# Patient Record
Sex: Female | Born: 1970 | Race: White | Hispanic: No | Marital: Married | Smoking: Former smoker
Health system: Southern US, Community
[De-identification: ages and names within clinical notes are randomized; demographics above are authoritative.]

## PROBLEM LIST (undated history)

## (undated) DIAGNOSIS — F419 Anxiety disorder, unspecified: Secondary | ICD-10-CM

## (undated) DIAGNOSIS — E785 Hyperlipidemia, unspecified: Secondary | ICD-10-CM

## (undated) HISTORY — DX: Anxiety disorder, unspecified: F41.9

## (undated) HISTORY — DX: Hyperlipidemia, unspecified: E78.5

---

## 2005-05-30 ENCOUNTER — Ambulatory Visit: Payer: Self-pay

## 2010-11-09 ENCOUNTER — Ambulatory Visit: Payer: Self-pay

## 2011-11-13 ENCOUNTER — Ambulatory Visit: Payer: Self-pay

## 2012-11-17 ENCOUNTER — Ambulatory Visit: Payer: Self-pay

## 2013-11-18 ENCOUNTER — Ambulatory Visit: Payer: Self-pay

## 2015-12-05 ENCOUNTER — Other Ambulatory Visit: Payer: Self-pay | Admitting: Obstetrics and Gynecology

## 2015-12-05 DIAGNOSIS — Z1231 Encounter for screening mammogram for malignant neoplasm of breast: Secondary | ICD-10-CM

## 2015-12-22 ENCOUNTER — Other Ambulatory Visit: Payer: Self-pay | Admitting: Obstetrics and Gynecology

## 2015-12-22 ENCOUNTER — Ambulatory Visit
Admission: RE | Admit: 2015-12-22 | Discharge: 2015-12-22 | Disposition: A | Discharge status: Home / Self Care | Payer: BC Managed Care – PPO | Source: Ambulatory Visit | Attending: Obstetrics and Gynecology | Admitting: Obstetrics and Gynecology

## 2015-12-22 DIAGNOSIS — Z1231 Encounter for screening mammogram for malignant neoplasm of breast: Secondary | ICD-10-CM

## 2016-11-15 ENCOUNTER — Other Ambulatory Visit: Payer: Self-pay | Admitting: Obstetrics and Gynecology

## 2016-11-15 DIAGNOSIS — Z1231 Encounter for screening mammogram for malignant neoplasm of breast: Secondary | ICD-10-CM

## 2016-12-27 ENCOUNTER — Ambulatory Visit
Admission: RE | Admit: 2016-12-27 | Discharge: 2016-12-27 | Disposition: A | Discharge status: Home / Self Care | Payer: BC Managed Care – PPO | Source: Ambulatory Visit | Attending: Obstetrics and Gynecology | Admitting: Obstetrics and Gynecology

## 2016-12-27 DIAGNOSIS — Z1231 Encounter for screening mammogram for malignant neoplasm of breast: Secondary | ICD-10-CM | POA: Diagnosis not present

## 2016-12-27 LAB — HM PAP SMEAR: HM Pap smear: NEGATIVE

## 2017-04-07 ENCOUNTER — Ambulatory Visit (INDEPENDENT_AMBULATORY_CARE_PROVIDER_SITE_OTHER): Payer: Self-pay | Admitting: Nurse Practitioner

## 2017-04-07 ENCOUNTER — Encounter: Payer: Self-pay | Admitting: Nurse Practitioner

## 2017-04-07 VITALS — BP 151/98 | HR 93 | Temp 98.2°F | Ht 65.0 in | Wt 220.0 lb

## 2017-04-07 DIAGNOSIS — Z7689 Persons encountering health services in other specified circumstances: Secondary | ICD-10-CM | POA: Diagnosis not present

## 2017-04-07 DIAGNOSIS — I10 Essential (primary) hypertension: Secondary | ICD-10-CM

## 2017-04-07 DIAGNOSIS — Z79899 Other long term (current) drug therapy: Secondary | ICD-10-CM | POA: Diagnosis not present

## 2017-04-07 DIAGNOSIS — E785 Hyperlipidemia, unspecified: Secondary | ICD-10-CM

## 2017-04-07 NOTE — Progress Notes (Signed)
Subjective:    Patient ID: Hannah Singleton, female    DOB: [birth year], 46 y.o.   MRN: [id number]  Hannah Singleton is a 46 y.o. female presenting on 04/07/2017 for Establish Care (elevated bp x 5 days. Heart palpations when she lays down at night x 5 days )   HPI Establish Care New Provider Pt last seen by GYN in February, [facility] in [place] 1 years ago.  Obtain records in CareEverywhere.    Hypertension Was initially told by her dentist Wednesday (5 days ago) that she had high blood pressure.  Also having heart palpitations each night x 5 days. Otherwise no symptoms.    Has been checking at home since Wednesday w/ home BP readings in 150s/ 95-100. HR 105-109  At prior medical visits, has had BP readings: 133/92 December 27, 2016 144/97 October 09, 2016 122/85 April 02, 2016 - 210 lbs  No prior EKG.  Pt denies headache, lightheadedness, dizziness, changes in vision, chest tightness/pressure, palpitations, leg swelling, sudden loss of speech or loss of consciousness.   Past Medical History:  Diagnosis Date  . Anxiety   . Hyperlipidemia    Past Surgical History:  Procedure Laterality Date  . CESAREAN SECTION     Social History   Social History  . Marital status: Married    Spouse name: N/A  . Number of children: N/A  . Years of education: N/A   Occupational History  . Not on file.   Social History Main Topics  . Smoking status: Former Smoker    Years: 3.00    Quit date: 04/08/2007  . Smokeless tobacco: Never Used     Comment: 2 cigarettes per day for 30 years  . Alcohol use 4.8 oz/week    8 Shots of liquor per week     Comment: no binge drinking  . Drug use: No  . Sexual activity: Yes    Birth control/ protection: Pill   Other Topics Concern  . Not on file   Social History Narrative  . No narrative on file   Family History  Problem Relation Age of Onset  . Diabetes Mother   . Depression Mother   . Colon cancer Father 66  . Breast cancer Neg Hx     No current outpatient prescriptions on file prior to visit.   No current facility-administered medications on file prior to visit.     Review of Systems  Constitutional: Negative.   HENT: Negative.   Eyes: Negative.   Respiratory: Negative.   Cardiovascular: Negative.   Gastrointestinal: Negative.   Endocrine: Negative.   Genitourinary: Negative.   Musculoskeletal: Negative.   Skin: Negative.   Allergic/Immunologic: Negative.   Neurological: Negative.   Hematological: Negative.   Psychiatric/Behavioral: Negative.    Per HPI unless specifically indicated above      Objective:    BP (!) 151/98 (BP Location: Right Arm, Patient Position: Sitting, Cuff Size: Large)   Pulse 93   Temp 98.2 F (36.8 C) (Oral)   Ht  (1.651 m)   Wt 220 lb (99.8 kg)   SpO2 100%   BMI 36.61 kg/m   Wt Readings from Last 3 Encounters:  04/07/17 220 lb (99.8 kg)    Physical Exam  General - obese, well-appearing, NAD HEENT - Normocephalic, atraumatic Neck - supple, non-tender, no LAD, no thyromegaly, no carotid bruit Heart - RRR, no murmurs heard Lungs - Clear throughout all lobes, no wheezing, crackles, or rhonchi. Normal work of breathing. Extremeties -  non-tender, no edema, cap refill < 2 seconds, peripheral pulses intact +2 bilaterally Skin - warm, dry Neuro - awake, alert, oriented x3, normal gait Psych - Normal mood and affect, normal behavior       Assessment & Plan:   Problem List Items Addressed This Visit      Cardiovascular and Mediastinum   Essential hypertension - Primary    New diagnosis hypertension.  Pt w/ elevation on multiple occasions at dentist, at home, and today in clinic.  BP goal < 130/80.  Plan: 1. Discussed hypertension and risks of CVA, hypertensive crisis, longterm kidney damage, eye damage, heart failure if untreated. 2. Start taking lisinopril.  Reviewed side effects of cough and angioedema.  If occur, please contact clinic. 3. Obtain labs CMP,  Lipid today  4. Encouraged heart healthy diet and increasing exercise to 30 minutes most days of the week. 5. Check BP 1-2 x per week at home, keep log, and bring to clinic at next appointment. 6. Follow up 1 month.        Relevant Orders   Comprehensive metabolic panel   BASIC METABOLIC PANEL WITH GFR   Lipid panel     Other   Hyperlipidemia    Pt w/ past diagnosis of hyperlipidemia not on medications.  Plan: 1. Check lipid and CMP.  2. Encouraged DASH diet. 3. Increase physical activity to 30 minutes most days of the week. 4. Follow up 3 months and after labs for medications if needed.      Relevant Orders   Comprehensive metabolic panel   Lipid panel    Other Visit Diagnoses    Encounter for long-term current use of high risk medication    Recheck BMP after starting lisinopril.  Discussed need for labs w/ patient.    Relevant Orders   BASIC METABOLIC PANEL WITH GFR   Encounter to establish care     Previous PCP was at  [facility] in [place].  Records have been reviewed in CareEverywhere.  Past medical, family, and surgical history reviewed w/ pt.       Meds ordered this encounter  Medications  . escitalopram (LEXAPRO) 5 MG tablet    Refill:  3  . norethindrone-ethinyl estradiol (MICROGESTIN,JUNEL,LOESTRIN) 1-20 MG-MCG tablet    Refill:  3    Follow up plan: Return in about 4 weeks (around 05/05/2017).  [name], DNP, AGPCNP-BC Adult Gerontology Primary Care Nurse Practitioner [facility] 04/10/2017, 3:43 PM

## 2017-04-07 NOTE — Patient Instructions (Addendum)
Hannah Singleton, Thank you for coming in to clinic today.  1. For your hypertension: - START with DASH eating plan.  Choose 1-2 things from this eating plan each week to start doing until you are eating mostly like this eating plan.  - Increase your physical activity until you are increasing your heart rate for 30 minutes on most days of the week.  - START lisinopril 10 mg once daily.  - 130/80 or less BUT higher than 100/70.    Signs of stroke? Act FAST! Face weakness or drooping Arm weakness (or leg weakness) Speech changes (loss of speech, slurred, or sounds that are not words) Time: act quickly, Call 9-1-1.  Preserve brain tissue!   Please schedule a follow-up appointment with Hannah Singleton, AGNP. Return in about 4 weeks (around 05/05/2017).  If you have any other questions or concerns, please feel free to call the clinic or send a message through MyChart. You may also schedule an earlier appointment if necessary.  You will receive a survey after today's visit either digitally by e-mail or paper by [employer]. Your experiences and feedback matter to [place].  Please respond so we know how we are doing as we provide care for you.   Hannah Mcardle, DNP, AGNP-BC Adult Gerontology Nurse Practitioner [facility], [facility]   DASH Eating Plan DASH stands for "Dietary Approaches to Stop Hypertension." The DASH eating plan is a healthy eating plan that has been shown to reduce high blood pressure (hypertension). It may also reduce your risk for type 2 diabetes, heart disease, and stroke. The DASH eating plan may also help with weight loss. What are tips for following this plan? General guidelines  Avoid eating more than 2,300 mg (milligrams) of salt (sodium) a day. If you have hypertension, you may need to reduce your sodium intake to 1,500 mg a day.  Limit alcohol intake to no more than 1 drink a day for nonpregnant women and 2 drinks a day for men. One drink equals 12 oz of beer, 5  oz of wine, or 1 oz of hard liquor.  Work with your health care provider to maintain a healthy body weight or to lose weight. Ask what an ideal weight is for you.  Get at least 30 minutes of exercise that causes your heart to beat faster (aerobic exercise) most days of the week. Activities may include walking, swimming, or biking.  Work with your health care provider or diet and nutrition specialist (dietitian) to adjust your eating plan to your individual calorie needs. Reading food labels  Check food labels for the amount of sodium per serving. Choose foods with less than 5 percent of the Daily Value of sodium. Generally, foods with less than 300 mg of sodium per serving fit into this eating plan.  To find whole grains, look for the word "whole" as the first word in the ingredient list. Shopping  Buy products labeled as "low-sodium" or "no salt added."  Buy fresh foods. Avoid canned foods and premade or frozen meals. Cooking  Avoid adding salt when cooking. Use salt-free seasonings or herbs instead of table salt or sea salt. Check with your health care provider or pharmacist before using salt substitutes.  Do not fry foods. Cook foods using healthy methods such as baking, boiling, grilling, and broiling instead.  Cook with heart-healthy oils, such as olive, canola, soybean, or sunflower oil. Meal planning   Eat a balanced diet that includes: ? 5 or more servings of fruits and  vegetables each day. At each meal, try to fill half of your plate with fruits and vegetables. ? Up to 6-8 servings of whole grains each day. ? Less than 6 oz of lean meat, poultry, or fish each day. A 3-oz serving of meat is about the same size as a deck of cards. One egg equals 1 oz. ? 2 servings of low-fat dairy each day. ? A serving of nuts, seeds, or beans 5 times each week. ? Heart-healthy fats. Healthy fats called Omega-3 fatty acids are found in foods such as flaxseeds and coldwater fish, like  sardines, salmon, and mackerel.  Limit how much you eat of the following: ? Canned or prepackaged foods. ? Food that is high in trans fat, such as fried foods. ? Food that is high in saturated fat, such as fatty meat. ? Sweets, desserts, sugary drinks, and other foods with added sugar. ? Full-fat dairy products.  Do not salt foods before eating.  Try to eat at least 2 vegetarian meals each week.  Eat more home-cooked food and less restaurant, buffet, and fast food.  When eating at a restaurant, ask that your food be prepared with less salt or no salt, if possible. What foods are recommended? The items listed may not be a complete list. Talk with your dietitian about what dietary choices are best for you. Grains Whole-grain or whole-wheat bread. Whole-grain or whole-wheat pasta. Brown rice. Orpah Cobb. Bulgur. Whole-grain and low-sodium cereals. Pita bread. Low-fat, low-sodium crackers. Whole-wheat flour tortillas. Vegetables Fresh or frozen vegetables (raw, steamed, roasted, or grilled). Low-sodium or reduced-sodium tomato and vegetable juice. Low-sodium or reduced-sodium tomato sauce and tomato paste. Low-sodium or reduced-sodium canned vegetables. Fruits All fresh, dried, or frozen fruit. Canned fruit in natural juice (without added sugar). Meat and other protein foods Skinless chicken or [place]. Ground chicken or [place]. Pork with fat trimmed off. Fish and seafood. Egg whites. Dried beans, peas, or lentils. Unsalted nuts, nut butters, and seeds. Unsalted canned beans. Lean cuts of beef with fat trimmed off. Low-sodium, lean deli meat. Dairy Low-fat (1%) or fat-free (skim) milk. Fat-free, low-fat, or reduced-fat cheeses. Nonfat, low-sodium ricotta or cottage cheese. Low-fat or nonfat yogurt. Low-fat, low-sodium cheese. Fats and oils Soft margarine without trans fats. Vegetable oil. Low-fat, reduced-fat, or light mayonnaise and salad dressings (reduced-sodium). Canola, safflower,  olive, soybean, and sunflower oils. Avocado. Seasoning and other foods Herbs. Spices. Seasoning mixes without salt. Unsalted popcorn and pretzels. Fat-free sweets. What foods are not recommended? The items listed may not be a complete list. Talk with your dietitian about what dietary choices are best for you. Grains Baked goods made with fat, such as croissants, muffins, or some breads. Dry pasta or rice meal packs. Vegetables Creamed or fried vegetables. Vegetables in a cheese sauce. Regular canned vegetables (not low-sodium or reduced-sodium). Regular canned tomato sauce and paste (not low-sodium or reduced-sodium). Regular tomato and vegetable juice (not low-sodium or reduced-sodium). Rosita Fire. Olives. Fruits Canned fruit in a light or heavy syrup. Fried fruit. Fruit in cream or butter sauce. Meat and other protein foods Fatty cuts of meat. Ribs. Fried meat. Tomasa Blase. Sausage. Bologna and other processed lunch meats. Salami. Fatback. Hotdogs. Bratwurst. Salted nuts and seeds. Canned beans with added salt. Canned or smoked fish. Whole eggs or egg yolks. Chicken or [place] with skin. Dairy Whole or 2% milk, cream, and half-and-half. Whole or full-fat cream cheese. Whole-fat or sweetened yogurt. Full-fat cheese. Nondairy creamers. Whipped toppings. Processed cheese and cheese spreads. Fats and oils  Butter. Stick margarine. Lard. Shortening. Ghee. Bacon fat. Tropical oils, such as coconut, palm kernel, or palm oil. Seasoning and other foods Salted popcorn and pretzels. Onion salt, garlic salt, seasoned salt, table salt, and sea salt. Worcestershire sauce. Tartar sauce. Barbecue sauce. Teriyaki sauce. Soy sauce, including reduced-sodium. Steak sauce. Canned and packaged gravies. Fish sauce. Oyster sauce. Cocktail sauce. Horseradish that you find on the shelf. Ketchup. Mustard. Meat flavorings and tenderizers. Bouillon cubes. Hot sauce and Tabasco sauce. Premade or packaged marinades. Premade or packaged  taco seasonings. Relishes. Regular salad dressings. Where to find more information:  National Heart, Lung, and Blood Institute: PopSteam.is  American Heart Association: www.heart.org Summary  The DASH eating plan is a healthy eating plan that has been shown to reduce high blood pressure (hypertension). It may also reduce your risk for type 2 diabetes, heart disease, and stroke.  With the DASH eating plan, you should limit salt (sodium) intake to 2,300 mg a day. If you have hypertension, you may need to reduce your sodium intake to 1,500 mg a day.  When on the DASH eating plan, aim to eat more fresh fruits and vegetables, whole grains, lean proteins, low-fat dairy, and heart-healthy fats.  Work with your health care provider or diet and nutrition specialist (dietitian) to adjust your eating plan to your individual calorie needs. This information is not intended to replace advice given to you by your health care provider. Make sure you discuss any questions you have with your health care provider. Document Released: 06/27/2011 Document Revised: 07/01/2016 Document Reviewed: 07/01/2016 Elsevier Interactive Patient Education  2017 [employer].

## 2017-04-08 ENCOUNTER — Other Ambulatory Visit: Payer: Self-pay

## 2017-04-08 MED ORDER — LISINOPRIL 10 MG PO TABS: 10.0000 mg | ORAL_TABLET | Freq: Every day | ORAL | 6 refills | Status: DC

## 2017-04-10 DIAGNOSIS — E785 Hyperlipidemia, unspecified: Secondary | ICD-10-CM | POA: Insufficient documentation

## 2017-04-10 DIAGNOSIS — I10 Essential (primary) hypertension: Secondary | ICD-10-CM | POA: Insufficient documentation

## 2017-04-10 NOTE — Assessment & Plan Note (Signed)
New diagnosis hypertension.  Pt w/ elevation on multiple occasions at dentist, at home, and today in clinic.  BP goal < 130/80.  Plan: 1. Discussed hypertension and risks of CVA, hypertensive crisis, longterm kidney damage, eye damage, heart failure if untreated. 2. Start taking lisinopril.  Reviewed side effects of cough and angioedema.  If occur, please contact clinic. 3. Obtain labs CMP, Lipid today  4. Encouraged heart healthy diet and increasing exercise to 30 minutes most days of the week. 5. Check BP 1-2 x per week at home, keep log, and bring to clinic at next appointment. 6. Follow up 1 month.

## 2017-04-10 NOTE — Assessment & Plan Note (Signed)
Pt w/ past diagnosis of hyperlipidemia not on medications.  Plan: 1. Check lipid and CMP.  2. Encouraged DASH diet. 3. Increase physical activity to 30 minutes most days of the week. 4. Follow up 3 months and after labs for medications if needed.

## 2017-04-11 ENCOUNTER — Other Ambulatory Visit: Payer: BC Managed Care – PPO

## 2017-04-11 DIAGNOSIS — Z79899 Other long term (current) drug therapy: Secondary | ICD-10-CM

## 2017-04-11 DIAGNOSIS — I10 Essential (primary) hypertension: Secondary | ICD-10-CM

## 2017-04-11 LAB — BASIC METABOLIC PANEL WITH GFR
BUN: 14 mg/dL (ref 7–25)
CO2: 22 mmol/L (ref 20–32)
Calcium: 8.7 mg/dL (ref 8.6–10.2)
Chloride: 101 mmol/L (ref 98–110)
Creat: 0.95 mg/dL (ref 0.50–1.10)
GFR, Est African American: 83 mL/min/{1.73_m2} (ref >=60)
GFR, Est Non African American: 71 mL/min/{1.73_m2} (ref >=60)
Glucose, Bld: 115 mg/dL — ABNORMAL HIGH (ref 65–99)
Potassium: 4.1 mmol/L (ref 3.5–5.3)
Sodium: 133 mmol/L — ABNORMAL LOW (ref 135–146)

## 2017-04-11 LAB — LIPID PANEL
Cholesterol: 250 mg/dL — ABNORMAL HIGH (ref <200)
HDL: 59 mg/dL (ref >50)
LDL Cholesterol (Calc): 150 mg/dL (calc) — ABNORMAL HIGH
Non-HDL Cholesterol (Calc): 191 mg/dL (calc) — ABNORMAL HIGH (ref <130)
Total CHOL/HDL Ratio: 4.2 (calc) (ref <5.0)
Triglycerides: 249 mg/dL — ABNORMAL HIGH (ref <150)

## 2017-05-08 ENCOUNTER — Ambulatory Visit (INDEPENDENT_AMBULATORY_CARE_PROVIDER_SITE_OTHER): Payer: BC Managed Care – PPO | Admitting: Nurse Practitioner

## 2017-05-08 ENCOUNTER — Encounter: Payer: Self-pay | Admitting: Nurse Practitioner

## 2017-05-08 VITALS — BP 122/67 | HR 88 | Temp 98.6°F | Ht 65.0 in | Wt 218.0 lb

## 2017-05-08 DIAGNOSIS — R7309 Other abnormal glucose: Secondary | ICD-10-CM

## 2017-05-08 DIAGNOSIS — I10 Essential (primary) hypertension: Secondary | ICD-10-CM

## 2017-05-08 NOTE — Patient Instructions (Addendum)
Hannah Singleton, Thank you for coming in to clinic today.  1. Your blood pressure looks great in clinic today! - Bring your machine to the clinic and we can see if it is accurate compared to a manual cuff.  [facility] can do this any day of the week at any time.  Please make an appointment with them.  - Continue lisinopril 10 mg once daily.   Please schedule a follow-up appointment with Hannah Singleton, Hannah Singleton. Return in about 3 months (around 08/08/2017) for hypertension.   If you have any other questions or concerns, please feel free to call the clinic or send a message through MyChart. You may also schedule an earlier appointment if necessary.  You will receive a survey after today's visit either digitally by e-mail or paper by [employer] mail. Your experiences and feedback matter to us.  Please respond so we know how we are doing as we provide care for you.   Hannah McardleLauren Anvith Mauriello, DNP, Hannah Singleton-BC Adult Gerontology Nurse Practitioner [facility], [facility]

## 2017-05-08 NOTE — Progress Notes (Signed)
Subjective:    Patient ID: Hannah Singleton, female    DOB: September 03, 1970, 46 y.o.   MRN: [id number]  Peony Barner is a 46 y.o. female presenting on 05/08/2017 for Hypertension   HPI Hypertension Felt "terrible" the first few days on medication, but improved w/ daily med dosing.   - She is checking BP at home or outside of clinic.  Readings 140-160/80-90  One reading 128/84.  She has a new BP cuff and is confused by her reading in clinic today after her home readings. - Current medications: lisinopril, tolerating well without side effects - She is not currently symptomatic. - Pt denies headache, lightheadedness, dizziness, changes in vision, chest tightness/pressure, palpitations, leg swelling, sudden loss of speech or loss of consciousness. - She  reports an exercise routine that includes walking, 4  days per week. - Her diet is low in salt, moderate in fat, and moderate in carbohydrates.   Social History  Substance Use Topics  . Smoking status: Former Smoker    Years: 3.00    Quit date: 04/08/2007  . Smokeless tobacco: Never Used     Comment: 2 cigarettes per day for 30 years  . Alcohol use 4.8 oz/week    8 Shots of liquor per week     Comment: no binge drinking    Review of Systems Per HPI unless specifically indicated above     Objective:    BP 122/67 (BP Location: Left Arm, Patient Position: Sitting, Cuff Size: Large)   Pulse 88   Temp 98.6 F (37 C) (Oral)   Ht 5\' 5"  (1.651 m)   Wt 218 lb (98.9 kg)   BMI 36.28 kg/m   Wt Readings from Last 3 Encounters:  05/08/17 218 lb (98.9 kg)  04/07/17 220 lb (99.8 kg)    Physical Exam  General - healthy, well-appearing, NAD HEENT - Normocephalic, atraumatic Neck - supple, non-tender, no LAD, no carotid bruit Heart - RRR, no murmurs heard Lungs - Clear throughout all lobes, no wheezing, crackles, or rhonchi. Normal work of breathing. Extremeties - non-tender, no edema, cap refill < 2 seconds, peripheral pulses intact +2  bilaterally Skin - warm, dry Neuro - awake, alert, oriented x3, normal gait Psych - Normal mood and affect, normal behavior    Results for orders placed or performed in visit on 04/11/17  BASIC METABOLIC PANEL WITH GFR  Result Value Ref Range   Glucose, Bld 115 (H) 65 - 99 mg/dL   BUN 14 7 - 25 mg/dL   Creat 4.09 8.11 - 9.14 mg/dL   GFR, Est Non African American 71 > OR = 60 mL/min/1.2m2   GFR, Est African American 83 > OR = 60 mL/min/1.65m2   BUN/Creatinine Ratio NOT APPLICABLE 6 - 22 (calc)   Sodium 133 (L) 135 - 146 mmol/L   Potassium 4.1 3.5 - 5.3 mmol/L   Chloride 101 98 - 110 mmol/L   CO2 22 20 - 32 mmol/L   Calcium 8.7 8.6 - 10.2 mg/dL  Lipid panel  Result Value Ref Range   Cholesterol 250 (H) <200 mg/dL   HDL 59 >78 mg/dL   Triglycerides 295 (H) <150 mg/dL   LDL Cholesterol (Calc) 150 (H) mg/dL (calc)   Total CHOL/HDL Ratio 4.2 <5.0 (calc)   Non-HDL Cholesterol (Calc) 191 (H) <130 mg/dL (calc)      Assessment & Plan:   Problem List Items Addressed This Visit      Cardiovascular and Mediastinum   Essential hypertension - Primary  Improved and at goal in clinic today.  BP goal < 130/80. Home readings consistently remain elevated, but have come down since starting medications.  Likely, home BP cuff needs calibration.  Plan: 1. Continue home BP checks.  Reinforced BP goal.  Can bring BP machine to clinic for reading w/ manual cuff at same time as machine for calibration.  Bring manual for machine. 2. Continue lisinopril 10 mg once daily since no side effects experienced. 3. Obtain labs BMP today assess kidney function after starting lisinopril. 4. Encouraged heart healthy diet and increasing exercise to 30 minutes most days of the week. 5. Follow up 3 months.        Relevant Orders   BASIC METABOLIC PANEL WITH GFR     Other   Abnormal glucose    Fasting glucose = 115 at last labs.  Family history of diabetes.  Plan: 1. Check Hgb A1c w/ labs today. 2.  Followup at least 1x per year if A1c normal.      Relevant Orders   Hemoglobin A1c       Follow up plan: Return in about 3 months (around 08/08/2017) for hypertension.  Wilhelmina McardleLauren Kenzli Barritt, DNP, AGPCNP-BC Adult Gerontology Primary Care Nurse Practitioner [facility] 05/08/2017, 10:05 AM

## 2017-05-08 NOTE — Assessment & Plan Note (Signed)
Fasting glucose = 115 at last labs.  Family history of diabetes.  Plan: 1. Check Hgb A1c w/ labs today. 2. Followup at least 1x per year if A1c normal.

## 2017-05-08 NOTE — Assessment & Plan Note (Signed)
Improved and at goal in clinic today.  BP goal < 130/80. Home readings consistently remain elevated, but have come down since starting medications.  Likely, home BP cuff needs calibration.  Plan: 1. Continue home BP checks.  Reinforced BP goal.  Can bring BP machine to clinic for reading w/ manual cuff at same time as machine for calibration.  Bring manual for machine. 2. Continue lisinopril 10 mg once daily since no side effects experienced. 3. Obtain labs BMP today assess kidney function after starting lisinopril. 4. Encouraged heart healthy diet and increasing exercise to 30 minutes most days of the week. 5. Follow up 3 months.

## 2017-05-09 LAB — BASIC METABOLIC PANEL WITH GFR
BUN: 12 mg/dL (ref 7–25)
CO2: 26 mmol/L (ref 20–32)
Calcium: 8.9 mg/dL (ref 8.6–10.2)
Chloride: 101 mmol/L (ref 98–110)
Creat: 0.85 mg/dL (ref 0.50–1.10)
GFR, Est African American: 95 mL/min/{1.73_m2} (ref >=60)
GFR, Est Non African American: 82 mL/min/{1.73_m2} (ref >=60)
Glucose, Bld: 110 mg/dL — ABNORMAL HIGH (ref 65–99)
Potassium: 4.2 mmol/L (ref 3.5–5.3)
Sodium: 136 mmol/L (ref 135–146)

## 2017-05-09 LAB — HEMOGLOBIN A1C
Hgb A1c MFr Bld: 5.5 % of total Hgb (ref <5.7)
Mean Plasma Glucose: 111 (calc)
eAG (mmol/L): 6.2 (calc)

## 2017-05-27 ENCOUNTER — Ambulatory Visit: Payer: BC Managed Care – PPO

## 2017-05-27 VITALS — BP 118/72 | HR 90

## 2017-05-27 DIAGNOSIS — I10 Essential (primary) hypertension: Secondary | ICD-10-CM

## 2017-06-10 ENCOUNTER — Telehealth: Payer: BC Managed Care – PPO | Admitting: Nurse Practitioner

## 2017-06-10 DIAGNOSIS — N3000 Acute cystitis without hematuria: Secondary | ICD-10-CM

## 2017-06-10 MED ORDER — CEPHALEXIN 500 MG PO CAPS: 500.0000 mg | ORAL_CAPSULE | Freq: Two times a day (BID) | ORAL | 0 refills | Status: DC

## 2017-06-10 NOTE — Progress Notes (Signed)

## 2017-08-08 ENCOUNTER — Ambulatory Visit: Payer: BC Managed Care – PPO | Admitting: Nurse Practitioner

## 2017-08-08 ENCOUNTER — Other Ambulatory Visit: Payer: Self-pay

## 2017-08-08 VITALS — BP 122/81 | HR 93 | Temp 98.1°F | Ht 65.0 in | Wt 220.4 lb

## 2017-08-08 DIAGNOSIS — I10 Essential (primary) hypertension: Secondary | ICD-10-CM

## 2017-08-08 DIAGNOSIS — F419 Anxiety disorder, unspecified: Secondary | ICD-10-CM | POA: Insufficient documentation

## 2017-08-08 MED ORDER — ESCITALOPRAM OXALATE 10 MG PO TABS: 10.0000 mg | ORAL_TABLET | Freq: Every day | ORAL | 5 refills | Status: DC

## 2017-08-08 MED ORDER — LISINOPRIL 10 MG PO TABS: 10.0000 mg | ORAL_TABLET | Freq: Every day | ORAL | 6 refills | Status: DC

## 2017-08-08 NOTE — Patient Instructions (Addendum)
Hannah Singleton, Thank you for coming in to clinic today.  1. Increase your escitalopram to 10 mg once daily.  - Continue to work on stress management strategies.  - Increase your physical activity until you are increasing your heart rate for 30 minutes on most days of the week.  Sleep Hygiene Tips  Take medicines only as directed by your health care provider.  Keep regular sleeping and waking hours. Avoid naps.  Keep a sleep diary to help you and your health care provider figure out what could be causing your insomnia. Include:  When you sleep.  When you wake up during the night.  How well you sleep.  How rested you feel the next day.  Any side effects of medicines you are taking.  What you eat and drink.  Make your bedroom a comfortable place where it is easy to fall asleep:  Put up shades or special blackout curtains to block light from outside.  Use a white noise machine to block noise.  Keep the temperature cool.  Exercise regularly as directed by your health care provider. Avoid exercising right before bedtime.  Use relaxation techniques to manage stress. Ask your health care provider to suggest some techniques that may work well for you. These may include:  Breathing exercises.  Routines to release muscle tension.  Visualizing peaceful scenes.  Cut back on alcohol, caffeinated beverages, and cigarettes, especially close to bedtime. These can disrupt your sleep.  Do not overeat or eat spicy foods right before bedtime. This can lead to digestive discomfort that can make it hard for you to sleep.  Limit screen use before bedtime. This includes:  Watching TV.  Using your smartphone, tablet, and computer.  Stick to a routine. This can help you fall asleep faster. Try to do a quiet activity, brush your teeth, and go to bed at the same time each night.  Get out of bed if you are still awake after 15 minutes of trying to sleep. Keep the lights down, but try reading or  doing a quiet activity. When you feel sleepy, go back to bed.  Make sure that you drive carefully. Avoid driving if you feel very sleepy.  Keep all follow-up appointments as directed by your health care provider. This is important.   2. For blood pressure: - Continue lisinopril 10 mg once daily.   Please schedule a follow-up appointment with Wilhelmina McardleLauren Mariangela Heldt, AGNP. Return in about 6 months (around 02/05/2018) for blood pressure and anxiety.  If you have any other questions or concerns, please feel free to call the clinic or send a message through MyChart. You may also schedule an earlier appointment if necessary.  You will receive a survey after today's visit either digitally by e-mail or paper by [employer] mail. Your experiences and feedback matter to us.  Please respond so we know how we are doing as we provide care for you.   Wilhelmina McardleLauren Alyiah Ulloa, DNP, AGNP-BC Adult Gerontology Nurse Practitioner [facility], [facility]

## 2017-08-08 NOTE — Progress Notes (Signed)
Subjective:    Patient ID: Hannah BaxterCynthia Singleton, female    DOB: 1971-07-01, 47 y.o.   MRN: [id number]  Hannah Singleton is a 47 y.o. female presenting on 08/08/2017 for Hypertension   HPI Hypertension - She is not checking BP at home or outside of clinic.    - Current medications: lisinopril 10 mg once daily, tolerating well without side effects - She is not currently symptomatic. - Pt denies headache, lightheadedness, dizziness, changes in vision, chest tightness/pressure, palpitations, leg swelling, sudden loss of speech or loss of consciousness. - She  reports no regular exercise routine, but has had some increase in activity without routine now that it is cold and has been so rainy. - Her diet is moderate in salt, high in fat, and moderate in carbohydrates.  Anxiety Pt has been taking escitalopram 5 mg once daily.  Has more irritability about things that should not irritate her and does regularly worry about things that don't need to be worried about.  She also reports difficulty with sleep.  She is able to fall asleep, but sometimes has trouble getting her thoughts to start racing.  She also awakens in the middle of the night and is awake for 2 hours before she can fall back to sleep.  She states racing thoughts is the reason to stay awake.  Depression screen [facility] 2/9 08/08/2017 08/08/2017 04/07/2017  Decreased Interest 0 0 0  Down, Depressed, Hopeless 0 0 0  PHQ - 2 Score 0 0 0  Altered sleeping 1 1 0  Tired, decreased energy 0 0 1  Change in appetite 1 1 0  Feeling bad or failure about yourself  0 0 0  Trouble concentrating 0 0 0  Moving slowly or fidgety/restless 0 0 0  Suicidal thoughts 0 0 0  PHQ-9 Score 2 2 1   Difficult doing work/chores Not difficult at all Not difficult at all Not difficult at all   GAD 7 : Generalized Anxiety Score 08/08/2017 04/07/2017  Nervous, Anxious, on Edge 1 0  Control/stop worrying 0 0  Worry too much - different things 1 1  Trouble relaxing 0 0  Restless 0  0  Easily annoyed or irritable 1 0  Afraid - awful might happen 0 0  Total GAD 7 Score 3 1  Anxiety Difficulty Not difficult at all Not difficult at all    Social History   Tobacco Use  . Smoking status: Former Smoker    Years: 3.00    Last attempt to quit: 04/08/2007    Years since quitting: 10.4  . Smokeless tobacco: Never Used  . Tobacco comment: 2 cigarettes per day for 30 years  Substance Use Topics  . Alcohol use: Yes    Alcohol/week: 4.8 oz    Types: 8 Shots of liquor per week    Comment: no binge drinking  . Drug use: No    Review of Systems Per HPI unless specifically indicated above     Objective:    BP 122/81 (BP Location: Right Arm, Patient Position: Sitting, Cuff Size: Large)   Pulse 93   Temp 98.1 F (36.7 C) (Oral)   Ht 5\' 5"  (1.651 m)   Wt 220 lb 6.4 oz (100 kg)   BMI 36.68 kg/m   Wt Readings from Last 3 Encounters:  08/08/17 220 lb 6.4 oz (100 kg)  05/08/17 218 lb (98.9 kg)  04/07/17 220 lb (99.8 kg)    Physical Exam  General - overweight, well-appearing, NAD HEENT - Normocephalic,  atraumatic Neck - supple, non-tender, no LAD, no thyromegaly, no carotid bruit Heart - RRR, no murmurs heard Lungs - Clear throughout all lobes, no wheezing, crackles, or rhonchi. Normal work of breathing. Extremeties - non-tender, no edema, cap refill < 2 seconds, peripheral pulses intact +2 bilaterally Skin - warm, dry Neuro - awake, alert, oriented x3, normal gait Psych - Normal mood and affect, normal behavior   Results for orders placed or performed in visit on 05/08/17  BASIC METABOLIC PANEL WITH GFR  Result Value Ref Range   Glucose, Bld 110 (H) 65 - 99 mg/dL   BUN 12 7 - 25 mg/dL   Creat 1.61 0.96 - 0.45 mg/dL   GFR, Est Non African American 82 > OR = 60 mL/min/1.103m2   GFR, Est African American 95 > OR = 60 mL/min/1.56m2   BUN/Creatinine Ratio NOT APPLICABLE 6 - 22 (calc)   Sodium 136 135 - 146 mmol/L   Potassium 4.2 3.5 - 5.3 mmol/L   Chloride 101  98 - 110 mmol/L   CO2 26 20 - 32 mmol/L   Calcium 8.9 8.6 - 10.2 mg/dL  Hemoglobin W0J  Result Value Ref Range   Hgb A1c MFr Bld 5.5 <5.7 % of total Hgb   Mean Plasma Glucose 111 (calc)   eAG (mmol/L) 6.2 (calc)      Assessment & Plan:   Problem List Items Addressed This Visit      Cardiovascular and Mediastinum   Essential hypertension - Primary    Improved and at goal in clinic today.  BP goal < 130/80. Home readings consistently remain elevated and is found to be inaccurate.  Plan: 1. Continue home BP checks with alternative machine.  Reinforced BP goal.  Can bring BP machine to clinic for reading w/ manual cuff at same time as machine for calibration.  Bring manual for machine. 2. Continue lisinopril 10 mg once daily since no side effects experienced. 3. Last labs normal in last 3 months. 4. Encouraged heart healthy diet and increasing exercise to 30 minutes most days of the week. 5. Follow up 6 months.        Relevant Medications   lisinopril (ZESTRIL) 10 MG tablet     Other   Anxiety    Pt currently stable on escitalopram 10 mg once daily.  Tolerates well and is currently managing stress well.   Plan: 1. Encouraged continued physical activity/healthy lifestyle. 2. Continue escitalpram without changes. 3. Followup 6 months.      Relevant Medications   escitalopram (LEXAPRO) 10 MG tablet   lisinopril (ZESTRIL) 10 MG tablet      Meds ordered this encounter  Medications  . escitalopram (LEXAPRO) 10 MG tablet    Sig: Take 1 tablet (10 mg total) by mouth daily.    Dispense:  30 tablet    Refill:  5    Order Specific Question:   Supervising Provider    Answer:   Smitty Cords [[id number]]  . lisinopril (ZESTRIL) 10 MG tablet    Sig: Take 1 tablet (10 mg total) by mouth daily.    Dispense:  30 tablet    Refill:  6    Order Specific Question:   Supervising Provider    Answer:   Smitty Cords [[id number]]      Follow up plan: Return in about 6  months (around 02/05/2018) for blood pressure and anxiety.   [name], DNP, AGPCNP-BC Adult Gerontology Primary Care Nurse Practitioner [facility] 08/29/2017, 1:42 PM

## 2017-08-14 ENCOUNTER — Telehealth: Payer: BC Managed Care – PPO | Admitting: Family

## 2017-08-14 DIAGNOSIS — B9689 Other specified bacterial agents as the cause of diseases classified elsewhere: Secondary | ICD-10-CM

## 2017-08-14 DIAGNOSIS — J329 Chronic sinusitis, unspecified: Secondary | ICD-10-CM

## 2017-08-14 MED ORDER — AMOXICILLIN-POT CLAVULANATE 875-125 MG PO TABS: 1.0000 | ORAL_TABLET | Freq: Two times a day (BID) | ORAL | 0 refills | Status: AC

## 2017-08-14 NOTE — Progress Notes (Signed)

## 2017-08-29 ENCOUNTER — Encounter: Payer: Self-pay | Admitting: Nurse Practitioner

## 2017-08-29 NOTE — Assessment & Plan Note (Signed)
Pt currently stable on escitalopram 10 mg once daily.  Tolerates well and is currently managing stress well.   Plan: 1. Encouraged continued physical activity/healthy lifestyle. 2. Continue escitalpram without changes. 3. Followup 6 months.

## 2017-08-29 NOTE — Assessment & Plan Note (Signed)
Improved and at goal in clinic today.  BP goal < 130/80. Home readings consistently remain elevated and is found to be inaccurate.  Plan: 1. Continue home BP checks with alternative machine.  Reinforced BP goal.  Can bring BP machine to clinic for reading w/ manual cuff at same time as machine for calibration.  Bring manual for machine. 2. Continue lisinopril 10 mg once daily since no side effects experienced. 3. Last labs normal in last 3 months. 4. Encouraged heart healthy diet and increasing exercise to 30 minutes most days of the week. 5. Follow up 6 months.

## 2017-08-30 ENCOUNTER — Telehealth: Payer: BC Managed Care – PPO | Admitting: Physician Assistant

## 2017-08-30 DIAGNOSIS — R300 Dysuria: Secondary | ICD-10-CM

## 2017-08-30 MED ORDER — CEPHALEXIN 500 MG PO CAPS: 500.0000 mg | ORAL_CAPSULE | Freq: Two times a day (BID) | ORAL | 0 refills | Status: AC

## 2017-08-30 NOTE — Progress Notes (Signed)

## 2017-09-02 ENCOUNTER — Ambulatory Visit: Payer: BC Managed Care – PPO | Admitting: Nurse Practitioner

## 2017-09-02 ENCOUNTER — Other Ambulatory Visit: Payer: Self-pay

## 2017-09-02 ENCOUNTER — Encounter: Payer: Self-pay | Admitting: Nurse Practitioner

## 2017-09-02 VITALS — BP 124/78 | HR 98 | Temp 98.8°F | Ht 65.0 in | Wt 221.4 lb

## 2017-09-02 DIAGNOSIS — N3000 Acute cystitis without hematuria: Secondary | ICD-10-CM | POA: Diagnosis not present

## 2017-09-02 DIAGNOSIS — R300 Dysuria: Secondary | ICD-10-CM | POA: Diagnosis not present

## 2017-09-02 LAB — POCT URINALYSIS DIPSTICK
Bilirubin, UA: NEGATIVE
Blood, UA: NEGATIVE
Glucose, UA: NEGATIVE
Ketones, UA: NEGATIVE
Leukocytes, UA: NEGATIVE
Nitrite, UA: NEGATIVE
Odor: NEGATIVE
Protein, UA: NEGATIVE
Spec Grav, UA: 1.01 (ref 1.010–1.025)
Urobilinogen, UA: 0.2 E.U./dL
pH, UA: 7 (ref 5.0–8.0)

## 2017-09-02 MED ORDER — PHENAZOPYRIDINE HCL 100 MG PO TABS: 100.0000 mg | ORAL_TABLET | Freq: Three times a day (TID) | ORAL | 0 refills | Status: DC | PRN

## 2017-09-02 NOTE — Patient Instructions (Addendum)
Aram BeechamCynthia, Thank you for coming in to clinic today.  1. Start taking Pyridium 100 mg tablet three times daily for your bladder pain  2. Continue taking full course of your antibiotic.  Return for repeat urine test 10 days after initial antibioitic if your symptoms persist.  Please schedule a follow-up appointment with Wilhelmina McardleLauren Lawrnce Reyez, AGNP. Return 6 days if symptoms worsen or fail to improve.  If you have any other questions or concerns, please feel free to call the clinic or send a message through MyChart. You may also schedule an earlier appointment if necessary.  You will receive a survey after today's visit either digitally by e-mail or paper by [employer] mail. Your experiences and feedback matter to us.  Please respond so we know how we are doing as we provide care for you.   Wilhelmina McardleLauren Demetrie Borge, DNP, AGNP-BC Adult Gerontology Nurse Practitioner [facility], [facility]

## 2017-09-02 NOTE — Progress Notes (Signed)
Subjective:    Patient ID: Hannah Singleton, female    DOB: 1971/03/07, 47 y.o.   MRN: [id number]  Hannah Singleton is a 47 y.o. female presenting on 09/02/2017 for Dysuria (nauseated, urinary frequency, and dysuria x 8 days ago. Pt had a e-visit x 4 days ago, diagnose w/ UTI and started on keflex)   HPI Dysuria Initial UTI symptoms started 8 days ago with dysuria, urinary frequency, nausea.  .Pt with e-visit 4 days ago and diagnosis of UTI and initiation of Keflex.  Pt noted initial improvement, but yesterday had significant urinary frequeny with low urine volumes again.  Is beginning to improve again today.  No blood in urine noted.  Pt also endorses pelvic pressure.  Denies flank pain, back pain, abdominal pain, fever, chills, or sweats.  Social History   Tobacco Use  . Smoking status: Former Smoker    Years: 3.00    Last attempt to quit: 04/08/2007    Years since quitting: 10.4  . Smokeless tobacco: Never Used  . Tobacco comment: 2 cigarettes per day for 30 years  Substance Use Topics  . Alcohol use: Yes    Alcohol/week: 4.8 oz    Types: 8 Shots of liquor per week    Comment: no binge drinking  . Drug use: No    Review of Systems Per HPI unless specifically indicated above     Objective:    BP 124/78 (BP Location: Right Arm, Patient Position: Sitting, Cuff Size: Normal)   Pulse 98   Temp 98.8 F (37.1 C) (Oral)   Ht 5\' 5"  (1.651 m)   Wt 221 lb 6.4 oz (100.4 kg)   BMI 36.84 kg/m   Wt Readings from Last 3 Encounters:  09/02/17 221 lb 6.4 oz (100.4 kg)  08/08/17 220 lb 6.4 oz (100 kg)  05/08/17 218 lb (98.9 kg)    Physical Exam  Constitutional: She is oriented to person, place, and time. She appears well-developed and well-nourished. No distress.  HENT:  Head: Normocephalic and atraumatic.  Cardiovascular: Normal rate, regular rhythm, S1 normal, S2 normal, normal heart sounds and intact distal pulses.  Pulmonary/Chest: Effort normal and breath sounds normal. No  respiratory distress.  Abdominal: Soft. Bowel sounds are normal. There is no hepatosplenomegaly. There is tenderness in the suprapubic area.  Neurological: She is alert and oriented to person, place, and time.  Skin: Skin is warm and dry.  Psychiatric: She has a normal mood and affect. Her behavior is normal.  Vitals reviewed.    Results for orders placed or performed in visit on 09/02/17  POCT Urinalysis Dipstick  Result Value Ref Range   Color, UA straw    Clarity, UA clear    Glucose, UA neg    Bilirubin, UA neg    Ketones, UA neg    Spec Grav, UA 1.010 1.010 - 1.025   Blood, UA neg    pH, UA 7.0 5.0 - 8.0   Protein, UA neg    Urobilinogen, UA 0.2 0.2 or 1.0 E.U./dL   Nitrite, UA neg    Leukocytes, UA Negative Negative   Appearance clear    Odor neg       Assessment & Plan:   Problem List Items Addressed This Visit    None    Visit Diagnoses    Dysuria    -  Primary Acute cystitis with hematuria.  Pt symptomatic currently with increased suprapubic pressure x 8 days. Currently without systemic signs or symptoms of infection.  Current use of Keflex as previously prescribed and new worsening symptoms today. - No current risk of concurrent STI.  Plan: 1. Continue Keflex 500mg  2 times daily until complete   - Send Urine culture for C&S: reveals multi-drug resistance.  START ciprofloxacin - START pyridium 100 mg tid prn x 3 days. 2. Provided non-pharm measures for UTI prevention for good hygiene. 3. Drink plenty of fluids and improve hydration over next 1 week. 4. Provided precautions for severe symptoms requiring ED visit to include no urine in 24-48 hours. 5. Followup 2-5 days as needed for worsening or persistent symptoms.     Relevant Orders   POCT Urinalysis Dipstick (Completed)      Meds ordered this encounter  Medications  . phenazopyridine (PYRIDIUM) 100 MG tablet    Sig: Take 1 tablet (100 mg total) by mouth 3 (three) times daily as needed for pain.     Dispense:  10 tablet    Refill:  0    Order Specific Question:   Supervising Provider    Answer:   Smitty CordsKARAMALEGOS, ALEXANDER J [[id number]]  . ciprofloxacin (CIPRO) 250 MG tablet    Sig: Take 1 tablet (250 mg total) by mouth 2 (two) times daily.    Dispense:  6 tablet    Refill:  0    Order Specific Question:   Supervising Provider    Answer:   Smitty CordsKARAMALEGOS, ALEXANDER J [[id number]]    Follow up plan: Return 6 days if symptoms worsen or fail to improve.  Hannah McardleLauren Joselynn Amoroso, DNP, AGPCNP-BC Adult Gerontology Primary Care Nurse Practitioner [facility] 09/02/2017, 3:31 PM

## 2017-09-05 ENCOUNTER — Telehealth: Payer: Self-pay

## 2017-09-05 LAB — URINE CULTURE
MICRO NUMBER:: 90186997
SPECIMEN QUALITY:: ADEQUATE

## 2017-09-05 MED ORDER — CIPROFLOXACIN HCL 250 MG PO TABS: 250.0000 mg | ORAL_TABLET | Freq: Two times a day (BID) | ORAL | 0 refills | Status: DC

## 2017-09-05 NOTE — Telephone Encounter (Signed)
The pt was notified. No questions or concerns. 

## 2017-09-05 NOTE — Telephone Encounter (Signed)
-----   Message from Galen ManilaLauren Renee Kennedy, NP sent at 09/05/2017  4:12 PM EST ----- Your UTI bacteria are E. coli  And Klebsiella pneumoniae.  They are both resistant to your current antibiotic.    START taking ciprofloxacin 250 mg twice daily for 3 days.  Sent to mychart, but please call to confirm pt receipt.

## 2017-09-23 ENCOUNTER — Ambulatory Visit
Admission: RE | Admit: 2017-09-23 | Discharge: 2017-09-23 | Disposition: A | Discharge status: Home / Self Care | Payer: BC Managed Care – PPO | Source: Ambulatory Visit | Attending: Nurse Practitioner | Admitting: Nurse Practitioner

## 2017-09-23 ENCOUNTER — Encounter: Payer: Self-pay | Admitting: Nurse Practitioner

## 2017-09-23 ENCOUNTER — Other Ambulatory Visit: Payer: Self-pay

## 2017-09-23 ENCOUNTER — Ambulatory Visit: Payer: BC Managed Care – PPO | Admitting: Nurse Practitioner

## 2017-09-23 VITALS — BP 135/88 | HR 109 | Temp 97.9°F | Resp 20 | Ht 65.0 in | Wt 220.8 lb

## 2017-09-23 DIAGNOSIS — R059 Cough, unspecified: Secondary | ICD-10-CM

## 2017-09-23 DIAGNOSIS — J22 Unspecified acute lower respiratory infection: Secondary | ICD-10-CM

## 2017-09-23 DIAGNOSIS — J3489 Other specified disorders of nose and nasal sinuses: Secondary | ICD-10-CM | POA: Diagnosis not present

## 2017-09-23 DIAGNOSIS — R05 Cough: Secondary | ICD-10-CM

## 2017-09-23 DIAGNOSIS — R509 Fever, unspecified: Secondary | ICD-10-CM | POA: Insufficient documentation

## 2017-09-23 MED ORDER — ALBUTEROL SULFATE HFA 108 (90 BASE) MCG/ACT IN AERS: 1.0000 | INHALATION_SPRAY | Freq: Four times a day (QID) | RESPIRATORY_TRACT | 5 refills | Status: DC | PRN

## 2017-09-23 MED ORDER — IPRATROPIUM BROMIDE 0.06 % NA SOLN: 2.0000 | Freq: Four times a day (QID) | NASAL | 0 refills | Status: DC

## 2017-09-23 MED ORDER — AZITHROMYCIN 250 MG PO TABS: ORAL_TABLET | ORAL | 0 refills | Status: DC

## 2017-09-23 NOTE — Progress Notes (Signed)
Subjective:    Patient ID: Hannah Singleton, female    DOB: 1971/05/03, 47 y.o.   MRN: [id number]  Hannah BaxterCynthia Singleton is a 47 y.o. female presenting on 09/23/2017 for Cough (chest congestion, productive cough, fever and malaise x 4 days )   HPI Cough Pt reports initial onset of symptoms on Saturday when she began feeling poorly with worsened on Sunday and continued symptoms.   Chest tightness, coughing, fever Sunday and Yesterday.   Today early am had fever.  Has taken tylenol.  Is having body aches, and malaise but are not severe. - Robitussin DM and is helping cough. - No known sick contacts, but does work for [employer].  Social History   Tobacco Use  . Smoking status: Former Smoker    Years: 3.00    Last attempt to quit: 04/08/2007    Years since quitting: 10.5  . Smokeless tobacco: Never Used  . Tobacco comment: 2 cigarettes per day for 30 years  Substance Use Topics  . Alcohol use: Yes    Alcohol/week: 4.8 oz    Types: 8 Shots of liquor per week    Comment: no binge drinking  . Drug use: No    Review of Systems Per HPI unless specifically indicated above     Objective:    BP 135/88 (BP Location: Right Arm, Patient Position: Sitting, Cuff Size: Normal)   Pulse (!) 109   Temp 97.9 F (36.6 C) (Oral)   Resp 20   Ht 5\' 5"  (1.651 m)   Wt 220 lb 12.8 oz (100.2 kg)   LMP 09/09/2017 (Approximate)   SpO2 100%   BMI 36.74 kg/m   Wt Readings from Last 3 Encounters:  09/23/17 220 lb 12.8 oz (100.2 kg)  09/02/17 221 lb 6.4 oz (100.4 kg)  08/08/17 220 lb 6.4 oz (100 kg)    Physical Exam  Constitutional: She appears well-developed and well-nourished. She appears distressed (mildly).  HENT:  Head: Normocephalic and atraumatic.  Right Ear: Hearing, tympanic membrane, external ear and ear canal normal.  Left Ear: Hearing, tympanic membrane, external ear and ear canal normal.  Nose: Mucosal edema and rhinorrhea present. Right sinus exhibits no maxillary sinus tenderness and no  frontal sinus tenderness. Left sinus exhibits no maxillary sinus tenderness and no frontal sinus tenderness.  Mouth/Throat: Uvula is midline and mucous membranes are normal. Posterior oropharyngeal edema and posterior oropharyngeal erythema (mildly injected) present. Oropharyngeal exudate: clear secretions.  Neck: Normal range of motion. Neck supple.  Cardiovascular: Normal rate, regular rhythm, S1 normal, S2 normal, normal heart sounds and intact distal pulses.  Pulmonary/Chest: Effort normal. No respiratory distress. She has wheezes (mild throughout). She has rhonchi (throughout). She has rales (throughout).  Lymphadenopathy:    She has cervical adenopathy.  Neurological: She is alert.  Skin: Skin is warm and dry.  Psychiatric: She has a normal mood and affect. Her behavior is normal.  Vitals reviewed.      Assessment & Plan:   Problem List Items Addressed This Visit    None    Visit Diagnoses    Cough    -  Primary   Relevant Medications   ipratropium (ATROVENT) 0.06 % nasal spray   azithromycin (ZITHROMAX) 250 MG tablet   Other Relevant Orders   DG Chest 2 View (Completed)   Lower respiratory infection (e.g., bronchitis, pneumonia, pneumonitis, pulmonitis)       Relevant Medications   ipratropium (ATROVENT) 0.06 % nasal spray   azithromycin (ZITHROMAX) 250 MG tablet  Other Relevant Orders   DG Chest 2 View (Completed)   Rhinorrhea       Relevant Medications   ipratropium (ATROVENT) 0.06 % nasal spray    Presumed CAP in otherwise healthy adult. May have evolved from prolonged viral URI / bronchitis or flu. Not recent hospitalization or IV antibiotics within past 90 days.  Possible flu in vaccinated adult. - Afebrile, tachycardic, no hypoxia  Plan: 1. Start empiric antibiotics with ceftriaxone IM 1g x 1 dose in office, Azithromycin PO 500mg  (day 1) then 250mg  daily x 4 days - Atrovent for Rhinorrhea. 2. Recommend improve hydration and add Mucinex-DM twice daily for up to  5-7 days if sputum still thick 3. Tessalon Perls for cough TID PRN 4. Tylenol, Ibuprofen PRN fevers 5. RTC within 1 week if not improving, otherwise strict return criteria to go to ED  Meds ordered this encounter  Medications  . ipratropium (ATROVENT) 0.06 % nasal spray    Sig: Place 2 sprays into both nostrils 4 (four) times daily.    Dispense:  15 mL    Refill:  0    Order Specific Question:   Supervising Provider    Answer:   Smitty Cords [[id number]]  . albuterol (PROVENTIL HFA;VENTOLIN HFA) 108 (90 Base) MCG/ACT inhaler    Sig: Inhale 1-2 puffs into the lungs every 6 (six) hours as needed for wheezing or shortness of breath.    Dispense:  1 Inhaler    Refill:  5    Product selection permitted for pt/insurance brand preference.    Order Specific Question:   Supervising Provider    Answer:   Smitty Cords [[id number]]  . azithromycin (ZITHROMAX) 250 MG tablet    Sig: Take one tablet twice today.  Then, take 1 tablet daily for 4 days.    Dispense:  6 tablet    Refill:  0    Order Specific Question:   Supervising Provider    Answer:   Smitty Cords [[id number]]      Follow up plan: Return 5-7 days if symptoms worsen or fail to improve.  Hannah Mcardle, DNP, AGPCNP-BC Adult Gerontology Primary Care Nurse Practitioner [facility] 10/13/2017, 11:47 AM

## 2017-09-23 NOTE — Patient Instructions (Addendum)
Hannah BeechamCynthia, Thank you for coming in to clinic today.  1. It sounds like you have an Lower Respiratory infection.  Recommend good hand washing. - START azithromycin 250 mg tablet. Take 2 tablets today.  Then, take 1 tablet daily for the next 4 days and stop.   Make sure to take all doses of your antibiotic. - START albuterol 1-2 puffs every 6 hours as needed for shortness of breath and wheezing. - While you are on an antibiotic, take a probiotic.  Antibiotics kill good and bad bacteria.  A probiotic helps to replace your good bacteria. Probiotic pills can be found over the counter.  One brand is Florastor, but you can use any brand you prefer.  You can also get good bacteria from foods.  These foods are yogurt, kefir, kombucha, and fresh, refrigerated and uncooked sauerkraut. - Drink plenty of fluids.  - Start Atrovent nasal spray decongestant 2 sprays each nostril up to 4 times daily for 5-7 days - Continue OTC Mucinex (or may try Mucinex-DM for cough) up to 7-10 days then stop - You may try over the counter Nasal Saline spray (Simply Saline, Ocean Spray) as needed to reduce congestion. - Start taking Tylenol extra strength 1 to 2 tablets every 6-8 hours for aches or fever/chills for next few days as needed.  Do not take more than 3,000 mg in 24 hours from all medicines.     - Drink warm herbal tea with honey for sore throat.   If symptoms are significantly worse with persistent fevers/chills despite tylenol/ibpurofen, nausea, vomiting unable to tolerate food/fluids or medicine, body aches, or shortness of breath, sinus pain pressure or worsening productive cough, then follow-up for re-evaluation, may seek more immediate care at Urgent Care or the ED if you are more concerned that it is an emergency.   Please schedule a follow-up appointment with Hannah Singleton, AGNP. Return 5-7 days if symptoms worsen or fail to improve.  If you have any other questions or concerns, please feel free to call the  clinic or send a message through MyChart. You may also schedule an earlier appointment if necessary.  You will receive a survey after today's visit either digitally by e-mail or paper by [employer] mail. Your experiences and feedback matter to us.  Please respond so we know how we are doing as we provide care for you.   Hannah McardleLauren Tiyona Desouza, DNP, AGNP-BC Adult Gerontology Nurse Practitioner [facility], [facility]

## 2017-10-13 ENCOUNTER — Encounter: Payer: Self-pay | Admitting: Nurse Practitioner

## 2017-12-22 ENCOUNTER — Other Ambulatory Visit: Payer: Self-pay | Admitting: Obstetrics and Gynecology

## 2017-12-22 DIAGNOSIS — Z1231 Encounter for screening mammogram for malignant neoplasm of breast: Secondary | ICD-10-CM

## 2017-12-23 ENCOUNTER — Encounter: Payer: Self-pay | Admitting: Nurse Practitioner

## 2018-01-25 ENCOUNTER — Other Ambulatory Visit: Payer: Self-pay | Admitting: Nurse Practitioner

## 2018-01-25 DIAGNOSIS — F419 Anxiety disorder, unspecified: Secondary | ICD-10-CM

## 2018-02-10 ENCOUNTER — Ambulatory Visit
Admission: RE | Admit: 2018-02-10 | Discharge: 2018-02-10 | Disposition: A | Discharge status: Home / Self Care | Payer: BC Managed Care – PPO | Source: Ambulatory Visit | Attending: Obstetrics and Gynecology | Admitting: Obstetrics and Gynecology

## 2018-02-10 DIAGNOSIS — Z1231 Encounter for screening mammogram for malignant neoplasm of breast: Secondary | ICD-10-CM | POA: Insufficient documentation

## 2018-02-13 ENCOUNTER — Ambulatory Visit: Payer: BC Managed Care – PPO | Admitting: Nurse Practitioner

## 2018-02-13 ENCOUNTER — Other Ambulatory Visit: Payer: Self-pay

## 2018-02-13 ENCOUNTER — Encounter: Payer: Self-pay | Admitting: Nurse Practitioner

## 2018-02-13 VITALS — BP 111/70 | HR 97 | Temp 98.2°F | Ht 65.0 in | Wt 224.6 lb

## 2018-02-13 DIAGNOSIS — E782 Mixed hyperlipidemia: Secondary | ICD-10-CM | POA: Diagnosis not present

## 2018-02-13 DIAGNOSIS — R7309 Other abnormal glucose: Secondary | ICD-10-CM | POA: Diagnosis not present

## 2018-02-13 DIAGNOSIS — I10 Essential (primary) hypertension: Secondary | ICD-10-CM

## 2018-02-13 MED ORDER — LISINOPRIL 10 MG PO TABS: 10.0000 mg | ORAL_TABLET | Freq: Every day | ORAL | 3 refills | Status: DC

## 2018-02-13 NOTE — Progress Notes (Signed)
Subjective:    Patient ID: Hannah Singleton, female    DOB: 19-Oct-1970, 47 y.o.   MRN: [id number]  Hannah Singleton is a 47 y.o. female presenting on 02/13/2018 for Hypertension   HPI Hypertension - She is not checking BP at home or outside of clinic.    - Current medications: lisinopril 10 mg once daily3, tolerating well without side effects - She is not currently symptomatic. - Pt denies headache, lightheadedness, dizziness, changes in vision, chest tightness/pressure, palpitations, leg swelling, sudden loss of speech or loss of consciousness. - She  reports no regular exercise routine.  She is still occasionally walking [facility], but less regularly than in past. - Her diet is moderate in salt, moderate in fat, and moderate in carbohydrates.   Hyperlipidemia Patient is currently not on any hyperlipidemia agents.   - Pt denies changes in vision, chest tightness/pressure, palpitations, shortness of breath, leg pain while walking, leg or arm weakness, and sudden loss of speech or loss of consciousness.   Social History   Tobacco Use  . Smoking status: Former Smoker    Years: 3.00    Last attempt to quit: 04/08/2007    Years since quitting: 10.8  . Smokeless tobacco: Never Used  Substance Use Topics  . Alcohol use: Yes    Alcohol/week: 4.8 oz    Types: 8 Shots of liquor per week    Comment: no binge drinking  . Drug use: No    Review of Systems Per HPI unless specifically indicated above     Objective:    BP 111/70 (BP Location: Left Arm, Patient Position: Sitting, Cuff Size: Large)   Pulse 97   Temp 98.2 F (36.8 C) (Oral)   Ht 5\' 5"  (1.651 m)   Wt 224 lb 9.6 oz (101.9 kg)   LMP 02/02/2018   BMI 37.38 kg/m   Wt Readings from Last 3 Encounters:  02/13/18 224 lb 9.6 oz (101.9 kg)  09/23/17 220 lb 12.8 oz (100.2 kg)  09/02/17 221 lb 6.4 oz (100.4 kg)    Physical Exam  Constitutional: She is oriented to person, place, and time. She appears well-developed and  well-nourished. No distress.  HENT:  Head: Normocephalic and atraumatic.  Neck: Normal range of motion. Neck supple. Carotid bruit is not present.  Cardiovascular: Normal rate, regular rhythm, S1 normal, S2 normal, normal heart sounds and intact distal pulses.  Pulmonary/Chest: Effort normal and breath sounds normal. No respiratory distress.  Musculoskeletal: She exhibits no edema (pedal).  Neurological: She is alert and oriented to person, place, and time.  Skin: Skin is warm and dry.  Psychiatric: She has a normal mood and affect. Her behavior is normal.  Vitals reviewed.    Results for orders placed or performed in visit on 09/02/17  Urine Culture  Result Value Ref Range   MICRO NUMBER: [id number]    SPECIMEN QUALITY: ADEQUATE    Sample Source URINE    STATUS: FINAL    ISOLATE 1: Escherichia coli (A)    ISOLATE 2: Klebsiella pneumoniae (A)       Susceptibility   Escherichia coli - URINE CULTURE, REFLEX    AMOX/CLAVULANIC >=32 Resistant     AMPICILLIN >=32 Resistant     AMPICILLIN/SULBACTAM >=32 Resistant     CEFAZOLIN* >=64 Resistant      * For uncomplicated UTI caused by E. coli,K. pneumoniae or P. mirabilis: Cefazolin issusceptible if MIC <32 mcg/mL and predictssusceptible to the oral agents cefaclor, cefdinir,cefpodoxime, cefprozil, cefuroxime, cephalexinand loracarbef.  CEFEPIME <=1 Sensitive     CEFTRIAXONE 16 Resistant     CIPROFLOXACIN <=0.25 Sensitive     LEVOFLOXACIN <=0.12 Sensitive     ERTAPENEM <=0.5 Sensitive     GENTAMICIN >=16 Resistant     IMIPENEM <=0.25 Sensitive     NITROFURANTOIN 64 Intermediate     PIP/TAZO 8 Sensitive     TOBRAMYCIN 4 Sensitive     TRIMETH/SULFA <=20 Sensitive    Klebsiella pneumoniae - URINE CULTURE NEGATIVE 2    AMOX/CLAVULANIC >=32 Resistant     AMPICILLIN >=32 Resistant     AMPICILLIN/SULBACTAM >=32 Resistant     CEFAZOLIN >=64 Resistant     CEFEPIME <=1 Sensitive     CEFTRIAXONE 16 Resistant     CIPROFLOXACIN <=0.25  Sensitive     LEVOFLOXACIN <=0.12 Sensitive     ERTAPENEM <=0.5 Sensitive     GENTAMICIN >=16 Resistant     IMIPENEM <=0.25 Sensitive     NITROFURANTOIN 64 Intermediate     PIP/TAZO 32 Intermediate     TOBRAMYCIN 2 Sensitive     TRIMETH/SULFA* <=20 Sensitive      * For uncomplicated UTI caused by E. coli,K. pneumoniae or P. mirabilis: Cefazolin issusceptible if MIC <32 mcg/mL and predictssusceptible to the oral agents cefaclor, cefdinir,cefpodoxime, cefprozil, cefuroxime, cephalexinand loracarbef.Legend:S = Susceptible  I = IntermediateR = Resistant  NS = Not susceptible* = Not tested  NR = Not reported**NN = See antimicrobic comments  POCT Urinalysis Dipstick  Result Value Ref Range   Color, UA straw    Clarity, UA clear    Glucose, UA neg    Bilirubin, UA neg    Ketones, UA neg    Spec Grav, UA 1.010 1.010 - 1.025   Blood, UA neg    pH, UA 7.0 5.0 - 8.0   Protein, UA neg    Urobilinogen, UA 0.2 0.2 or 1.0 E.U./dL   Nitrite, UA neg    Leukocytes, UA Negative Negative   Appearance clear    Odor neg       Assessment & Plan:   Problem List Items Addressed This Visit      Cardiovascular and Mediastinum   Essential hypertension - Primary Controlled hypertension.  BP goal < 130/80.  Pt is not consistently working on lifestyle modifications.  Taking medications tolerating well without side effects. No currently identified complications.  Plan: 1. Continue taking lisinopril 10 mg once daily 2. Obtain labs today  3. Encouraged heart healthy diet and increasing exercise to 30 minutes most days of the week. 4. Check BP 1-2 x per week at home, keep log, and bring to clinic at next appointment. 5. Follow up 6 months.     Relevant Medications   lisinopril (ZESTRIL) 10 MG tablet   Other Relevant Orders   COMPLETE METABOLIC PANEL WITH GFR (Completed)     Other   Hyperlipidemia Stable today on exam.  Medications not currently needed.  Continue to monitor and add meds if needed for  reducing ASCVD risk.   .  Check labs today. Followup 6 months.    Relevant Medications   lisinopril (ZESTRIL) 10 MG tablet   Other Relevant Orders   TSH (Completed)   Lipid panel (Completed)   Abnormal glucose Noted on last chemistry panel.  Needs additional screening for DM.  Collect A1c today.   Relevant Orders   COMPLETE METABOLIC PANEL WITH GFR (Completed)   Hemoglobin A1c (Completed)      Meds ordered this encounter  Medications  .  lisinopril (ZESTRIL) 10 MG tablet    Sig: Take 1 tablet (10 mg total) by mouth daily.    Dispense:  90 tablet    Refill:  3    Order Specific Question:   Supervising Provider    Answer:   Smitty Cords [[id number]]    Follow up plan: Return in about 6 months (around 08/16/2018) for hypertension AND cholesterol.  [name], DNP, AGPCNP-BC Adult Gerontology Primary Care Nurse Practitioner [facility] 02/13/2018, 11:08 AM

## 2018-02-13 NOTE — Patient Instructions (Signed)
Normajean BaxterCynthia Ellenbecker "Normajean Baxterynthia Gad",   Thank you for coming in to clinic today.  1. Continue lisinopril 10 mg once daily  2. Eat a healthy, low glycemic diet to continue improving cholesterol.  Please schedule a follow-up appointment with Wilhelmina McardleLauren Brenn Gatton, AGNP. Return in about 6 months (around 08/16/2018) for hypertension AND cholesterol.  If you have any other questions or concerns, please feel free to call the clinic or send a message through MyChart. You may also schedule an earlier appointment if necessary.  You will receive a survey after today's visit either digitally by e-mail or paper by [employer] mail. Your experiences and feedback matter to us.  Please respond so we know how we are doing as we provide care for you.   Wilhelmina McardleLauren Sloane Junkin, DNP, AGNP-BC Adult Gerontology Nurse Practitioner [facility], [facility]

## 2018-02-19 ENCOUNTER — Other Ambulatory Visit: Payer: BC Managed Care – PPO

## 2018-02-20 LAB — COMPLETE METABOLIC PANEL WITH GFR
AG Ratio: 1.6 (calc) (ref 1.0–2.5)
ALT: 11 U/L (ref 6–29)
AST: 15 U/L (ref 10–35)
Albumin: 3.9 g/dL (ref 3.6–5.1)
Alkaline phosphatase (APISO): 82 U/L (ref 33–115)
BUN: 15 mg/dL (ref 7–25)
CO2: 24 mmol/L (ref 20–32)
Calcium: 8.6 mg/dL (ref 8.6–10.2)
Chloride: 104 mmol/L (ref 98–110)
Creat: 0.93 mg/dL (ref 0.50–1.10)
GFR, Est African American: 84 mL/min/{1.73_m2} (ref >=60)
GFR, Est Non African American: 73 mL/min/{1.73_m2} (ref >=60)
Globulin: 2.5 g/dL (calc) (ref 1.9–3.7)
Glucose, Bld: 116 mg/dL — ABNORMAL HIGH (ref 65–99)
Potassium: 4.7 mmol/L (ref 3.5–5.3)
Sodium: 138 mmol/L (ref 135–146)
Total Bilirubin: 0.4 mg/dL (ref 0.2–1.2)
Total Protein: 6.4 g/dL (ref 6.1–8.1)

## 2018-02-20 LAB — LIPID PANEL
Cholesterol: 244 mg/dL — ABNORMAL HIGH (ref <200)
HDL: 70 mg/dL (ref >50)
LDL Cholesterol (Calc): 141 mg/dL (calc) — ABNORMAL HIGH
Non-HDL Cholesterol (Calc): 174 mg/dL (calc) — ABNORMAL HIGH (ref <130)
Total CHOL/HDL Ratio: 3.5 (calc) (ref <5.0)
Triglycerides: 189 mg/dL — ABNORMAL HIGH (ref <150)

## 2018-02-20 LAB — HEMOGLOBIN A1C
Hgb A1c MFr Bld: 5.6 % of total Hgb (ref <5.7)
Mean Plasma Glucose: 114 (calc)
eAG (mmol/L): 6.3 (calc)

## 2018-02-20 LAB — TSH: TSH: 1.8 mIU/L

## 2018-05-15 ENCOUNTER — Encounter: Payer: Self-pay | Admitting: Nurse Practitioner

## 2018-08-14 ENCOUNTER — Encounter: Payer: Self-pay | Admitting: Nurse Practitioner

## 2018-08-14 ENCOUNTER — Other Ambulatory Visit: Payer: Self-pay

## 2018-08-14 ENCOUNTER — Ambulatory Visit: Payer: BC Managed Care – PPO | Admitting: Nurse Practitioner

## 2018-08-14 VITALS — BP 122/82 | HR 82 | Temp 98.3°F | Ht 65.0 in | Wt 211.4 lb

## 2018-08-14 DIAGNOSIS — I10 Essential (primary) hypertension: Secondary | ICD-10-CM | POA: Diagnosis not present

## 2018-08-14 DIAGNOSIS — F419 Anxiety disorder, unspecified: Secondary | ICD-10-CM | POA: Diagnosis not present

## 2018-08-14 DIAGNOSIS — E782 Mixed hyperlipidemia: Secondary | ICD-10-CM

## 2018-08-14 MED ORDER — ESCITALOPRAM OXALATE 10 MG PO TABS: 10.0000 mg | ORAL_TABLET | Freq: Every day | ORAL | 4 refills | Status: DC

## 2018-08-14 MED ORDER — LISINOPRIL 10 MG PO TABS: 10.0000 mg | ORAL_TABLET | Freq: Every day | ORAL | 3 refills | Status: DC

## 2018-08-14 NOTE — Progress Notes (Signed)
Subjective:    Patient ID: Hannah Singleton, female    DOB: [birth year], 48 y.o.   MRN: [id number]  Griffin Takata is a 48 y.o. female presenting on 08/14/2018 for Hypertension and Hyperlipidemia   HPI Hypertension - She is not checking BP at home or outside of clinic.    - Current medications: lisnopril 10 mg once daily, tolerating well without side effects - She is not currently symptomatic. - Pt denies headache, lightheadedness, dizziness, changes in vision, chest tightness/pressure, palpitations, leg swelling, sudden loss of speech or loss of consciousness. - She  reports an exercise routine that includes walking at work more often.  Walking for about 30 minutes 1-3 days per week when weather is good. - Her diet is low in salt, moderate in fat, and moderate in carbohydrates.  Works to eat a generally heart healthy diet.  Hyperlipidemia Patient continues with diet and lifestyle management of cholesterol currently.  Patient is not on medications. - Pt denies changes in vision, chest tightness/pressure, palpitations, shortness of breath, leg pain while walking, leg or arm weakness, and sudden loss of speech or loss of consciousness.  - Patient continues with [facility] physical at [facility].  No regular labs check with this physical.  Social History   Tobacco Use  . Smoking status: Former Smoker    Years: 3.00    Last attempt to quit: 04/08/2007    Years since quitting: 11.3  . Smokeless tobacco: Never Used  Substance Use Topics  . Alcohol use: Yes    Alcohol/week: 8.0 standard drinks    Types: 8 Shots of liquor per week    Comment: no binge drinking  . Drug use: No    Review of Systems Per HPI unless specifically indicated above     Objective:    BP 122/82 (BP Location: Right Arm, Patient Position: Sitting, Cuff Size: Normal)   Pulse 82   Temp 98.3 F (36.8 C) (Oral)   Ht 5\' 5"  (1.651 m)   Wt 211 lb 6.4 oz (95.9 kg)   BMI 35.18 kg/m   Wt Readings from Last 3  Encounters:  08/14/18 211 lb 6.4 oz (95.9 kg)  02/13/18 224 lb 9.6 oz (101.9 kg)  09/23/17 220 lb 12.8 oz (100.2 kg)    Physical Exam Vitals signs reviewed.  Constitutional:      General: She is awake. She is not in acute distress.    Appearance: She is well-developed.  HENT:     Head: Normocephalic and atraumatic.  Neck:     Musculoskeletal: Normal range of motion and neck supple.     Vascular: No carotid bruit.  Cardiovascular:     Rate and Rhythm: Normal rate and regular rhythm.     Pulses:          Radial pulses are 2+ on the right side and 2+ on the left side.       Posterior tibial pulses are 1+ on the right side and 1+ on the left side.     Heart sounds: Normal heart sounds, S1 normal and S2 normal.  Pulmonary:     Effort: Pulmonary effort is normal. No respiratory distress.     Breath sounds: Normal breath sounds and air entry.  Musculoskeletal:     Right lower leg: No edema.     Left lower leg: No edema.  Skin:    General: Skin is warm and dry.     Capillary Refill: Capillary refill takes less than 2 seconds.  Neurological:     General: No focal deficit present.     Mental Status: She is alert and oriented to person, place, and time. Mental status is at baseline.  Psychiatric:        Attention and Perception: Attention normal.        Mood and Affect: Mood and affect normal.        Behavior: Behavior normal. Behavior is cooperative.    Results for orders placed or performed in visit on 02/13/18  TSH  Result Value Ref Range   TSH 1.80 mIU/L  Lipid panel  Result Value Ref Range   Cholesterol 244 (H) <200 mg/dL   HDL 70 >16>50 mg/dL   Triglycerides 109189 (H) <150 mg/dL   LDL Cholesterol (Calc) 141 (H) mg/dL (calc)   Total CHOL/HDL Ratio 3.5 <5.0 (calc)   Non-HDL Cholesterol (Calc) 174 (H) <130 mg/dL (calc)  COMPLETE METABOLIC PANEL WITH GFR  Result Value Ref Range   Glucose, Bld 116 (H) 65 - 99 mg/dL   BUN 15 7 - 25 mg/dL   Creat 6.040.93 5.400.50 - 9.811.10 mg/dL   GFR,  Est Non African American 73 > OR = 60 mL/min/1.5073m2   GFR, Est African American 84 > OR = 60 mL/min/1.9473m2   BUN/Creatinine Ratio NOT APPLICABLE 6 - 22 (calc)   Sodium 138 135 - 146 mmol/L   Potassium 4.7 3.5 - 5.3 mmol/L   Chloride 104 98 - 110 mmol/L   CO2 24 20 - 32 mmol/L   Calcium 8.6 8.6 - 10.2 mg/dL   Total Protein 6.4 6.1 - 8.1 g/dL   Albumin 3.9 3.6 - 5.1 g/dL   Globulin 2.5 1.9 - 3.7 g/dL (calc)   AG Ratio 1.6 1.0 - 2.5 (calc)   Total Bilirubin 0.4 0.2 - 1.2 mg/dL   Alkaline phosphatase (APISO) 82 33 - 115 U/L   AST 15 10 - 35 U/L   ALT 11 6 - 29 U/L  Hemoglobin A1c  Result Value Ref Range   Hgb A1c MFr Bld 5.6 <5.7 % of total Hgb   Mean Plasma Glucose 114 (calc)   eAG (mmol/L) 6.3 (calc)      Assessment & Plan:   Problem List Items Addressed This Visit      Cardiovascular and Mediastinum   Essential hypertension - Primary Controlled hypertension.  BP goal < 130/80.  Pt is working on lifestyle modifications.  Taking medications tolerating well without side effects. No current complications.  Plan: 1. Continue taking lisinoipril 10 mg once daily 2. Obtain labs today  3. Encouraged heart healthy diet and increasing exercise to 30 minutes most days of the week. 4. Check BP 1-2 x per week at home, keep log, and bring to clinic at next appointment. 5. Follow up 12 months.     Relevant Orders   COMPLETE METABOLIC PANEL WITH GFR     Other   Hyperlipidemia Controlled today on exam.  Medications not started at this time due to low ASCVD 10 year risk.  Continue without medications for now.   .  Check labs today. Followup 12 months.   Relevant Orders   Lipid panel   COMPLETE METABOLIC PANEL WITH GFR      No orders of the defined types were placed in this encounter.   Follow up plan: Return in about 1 year (around 08/15/2019) for hypertension, cholesterol.  Wilhelmina McardleLauren Biff Rutigliano, DNP, AGPCNP-BC Adult Gerontology Primary Care Nurse Practitioner [facility] 08/14/2018, 9:37  AM

## 2018-08-14 NOTE — Patient Instructions (Addendum)
Hannah Singleton,   Thank you for coming in to clinic today.  1. Continue on lisinopril 10 mg once daily.  2. Continue work toward eating a heart healthy diet and staying active.  Continue toward weight loss slowly over the next 6 months for another 5-10 lbs.  Then total of about 20 lbs in 1 year.  Please schedule a follow-up appointment with [name], AGNP. Return in about 1 year (around 08/15/2019) for hypertension, cholesterol.  If you have any other questions or concerns, please feel free to call the clinic or send a message through MyChart. You may also schedule an earlier appointment if necessary.  You will receive a survey after today's visit either digitally by e-mail or paper by [employer]. Your experiences and feedback matter to [place].  Please respond so we know how we are doing as we provide care for you.   [name], DNP, AGNP-BC Adult Gerontology Nurse Practitioner [facility], [facility]

## 2018-08-15 LAB — COMPLETE METABOLIC PANEL WITH GFR
AG Ratio: 1.3 (calc) (ref 1.0–2.5)
ALT: 31 U/L — ABNORMAL HIGH (ref 6–29)
AST: 31 U/L (ref 10–35)
Albumin: 3.9 g/dL (ref 3.6–5.1)
Alkaline phosphatase (APISO): 71 U/L (ref 33–115)
BUN: 16 mg/dL (ref 7–25)
CO2: 24 mmol/L (ref 20–32)
Calcium: 9.3 mg/dL (ref 8.6–10.2)
Chloride: 104 mmol/L (ref 98–110)
Creat: 0.96 mg/dL (ref 0.50–1.10)
GFR, Est African American: 81 mL/min/{1.73_m2} (ref >=60)
GFR, Est Non African American: 70 mL/min/{1.73_m2} (ref >=60)
Globulin: 2.9 g/dL (calc) (ref 1.9–3.7)
Glucose, Bld: 94 mg/dL (ref 65–99)
Potassium: 4.8 mmol/L (ref 3.5–5.3)
Sodium: 139 mmol/L (ref 135–146)
Total Bilirubin: 0.5 mg/dL (ref 0.2–1.2)
Total Protein: 6.8 g/dL (ref 6.1–8.1)

## 2018-08-15 LAB — LIPID PANEL
Cholesterol: 280 mg/dL — ABNORMAL HIGH (ref <200)
HDL: 78 mg/dL (ref >50)
LDL Cholesterol (Calc): 176 mg/dL (calc) — ABNORMAL HIGH
Non-HDL Cholesterol (Calc): 202 mg/dL (calc) — ABNORMAL HIGH (ref <130)
Total CHOL/HDL Ratio: 3.6 (calc) (ref <5.0)
Triglycerides: 128 mg/dL (ref <150)

## 2018-11-06 ENCOUNTER — Telehealth: Payer: BC Managed Care – PPO | Admitting: Family

## 2018-11-06 DIAGNOSIS — R300 Dysuria: Secondary | ICD-10-CM

## 2018-11-06 MED ORDER — NITROFURANTOIN MONOHYD MACRO 100 MG PO CAPS: 100.0000 mg | ORAL_CAPSULE | Freq: Two times a day (BID) | ORAL | 0 refills | Status: DC

## 2018-11-06 NOTE — Progress Notes (Signed)

## 2019-01-01 ENCOUNTER — Other Ambulatory Visit: Payer: Self-pay | Admitting: Obstetrics and Gynecology

## 2019-01-01 DIAGNOSIS — Z1231 Encounter for screening mammogram for malignant neoplasm of breast: Secondary | ICD-10-CM

## 2019-01-08 NOTE — Progress Notes (Signed)
Greater than 5 minutes, yet less than 10 minutes of time have been spent researching, coordinating, and implementing care for this patient today.  Thank you for the details you included in the comment boxes. Those details are very helpful in determining the best course of treatment for you and help us to provide the best care.  

## 2019-02-07 ENCOUNTER — Telehealth: Payer: BC Managed Care – PPO | Admitting: Nurse Practitioner

## 2019-02-07 DIAGNOSIS — J0100 Acute maxillary sinusitis, unspecified: Secondary | ICD-10-CM | POA: Diagnosis not present

## 2019-02-07 MED ORDER — AMOXICILLIN-POT CLAVULANATE 875-125 MG PO TABS: 1.0000 | ORAL_TABLET | Freq: Two times a day (BID) | ORAL | 0 refills | Status: DC

## 2019-02-07 NOTE — Progress Notes (Signed)

## 2019-02-17 ENCOUNTER — Other Ambulatory Visit: Payer: Self-pay

## 2019-02-17 ENCOUNTER — Ambulatory Visit
Admission: RE | Admit: 2019-02-17 | Discharge: 2019-02-17 | Disposition: A | Discharge status: Home / Self Care | Payer: BC Managed Care – PPO | Source: Ambulatory Visit | Attending: Obstetrics and Gynecology | Admitting: Obstetrics and Gynecology

## 2019-02-17 DIAGNOSIS — Z1231 Encounter for screening mammogram for malignant neoplasm of breast: Secondary | ICD-10-CM

## 2019-08-13 ENCOUNTER — Ambulatory Visit: Payer: BC Managed Care – PPO | Admitting: Nurse Practitioner

## 2019-08-14 ENCOUNTER — Other Ambulatory Visit: Payer: Self-pay | Admitting: Nurse Practitioner

## 2019-08-14 DIAGNOSIS — F419 Anxiety disorder, unspecified: Secondary | ICD-10-CM

## 2019-08-14 DIAGNOSIS — I10 Essential (primary) hypertension: Secondary | ICD-10-CM

## 2019-09-15 ENCOUNTER — Encounter: Payer: Self-pay | Admitting: Family Medicine

## 2019-09-15 ENCOUNTER — Other Ambulatory Visit: Payer: Self-pay

## 2019-09-15 ENCOUNTER — Ambulatory Visit (INDEPENDENT_AMBULATORY_CARE_PROVIDER_SITE_OTHER): Payer: BC Managed Care – PPO | Admitting: Family Medicine

## 2019-09-15 VITALS — BP 122/69 | HR 97 | Temp 97.1°F | Ht 65.0 in | Wt 230.0 lb

## 2019-09-15 DIAGNOSIS — E782 Mixed hyperlipidemia: Secondary | ICD-10-CM

## 2019-09-15 DIAGNOSIS — F419 Anxiety disorder, unspecified: Secondary | ICD-10-CM

## 2019-09-15 DIAGNOSIS — I10 Essential (primary) hypertension: Secondary | ICD-10-CM | POA: Diagnosis not present

## 2019-09-15 DIAGNOSIS — E669 Obesity, unspecified: Secondary | ICD-10-CM | POA: Insufficient documentation

## 2019-09-15 DIAGNOSIS — Z6839 Body mass index (BMI) 39.0-39.9, adult: Secondary | ICD-10-CM

## 2019-09-15 DIAGNOSIS — E66812 Obesity, class 2: Secondary | ICD-10-CM

## 2019-09-15 MED ORDER — LISINOPRIL 10 MG PO TABS: ORAL_TABLET | ORAL | 3 refills | Status: AC

## 2019-09-15 MED ORDER — ESCITALOPRAM OXALATE 10 MG PO TABS: ORAL_TABLET | ORAL | 3 refills | Status: AC

## 2019-09-15 NOTE — Progress Notes (Signed)
Subjective:    Patient ID: Hannah Singleton, female    DOB: April 11, 1971, 49 y.o.   MRN: [id number]  Hannah Singleton is a 49 y.o. female presenting on 09/15/2019 for Hypertension   HPI  [name] presents to clinic today for follow up on Hypertension and Anxiety.  Is currently tolerating her Lisinopril 10mg  and Lexapro 10mg  daily well.  Has not been checking her blood pressures regularly but denies any headaches, visual changes, lightheadedness, dizziness, abdominal pain, n/v/d. Denies increased anxiety/depression. Has no concerns today.  Depression screen [facility] 2/9 09/15/2019 08/14/2018 02/13/2018  Decreased Interest 0 0 0  Down, Depressed, Hopeless 0 0 0  PHQ - 2 Score 0 0 0  Altered sleeping 0 0 0  Tired, decreased energy 0 0 0  Change in appetite 0 0 0  Feeling bad or failure about yourself  0 0 0  Trouble concentrating 0 0 0  Moving slowly or fidgety/restless 0 0 0  Suicidal thoughts 0 0 0  PHQ-9 Score 0 0 0  Difficult doing work/chores Not difficult at all Not difficult at all Not difficult at all    Social History   Tobacco Use  . Smoking status: Former Smoker    Years: 3.00    Quit date: 04/08/2007    Years since quitting: 12.4  . Smokeless tobacco: Never Used  Substance Use Topics  . Alcohol use: Yes    Alcohol/week: 8.0 standard drinks    Types: 8 Standard drinks or equivalent per week    Comment: no binge drinking  . Drug use: No    Review of Systems  Constitutional: Negative.   HENT: Negative.   Eyes: Negative.   Respiratory: Negative.   Cardiovascular: Negative.   Gastrointestinal: Negative.   Endocrine: Negative.   Genitourinary: Negative.   Musculoskeletal: Negative.   Skin: Negative.   Allergic/Immunologic: Negative.   Neurological: Negative.   Hematological: Negative.   Psychiatric/Behavioral: Negative.    Per HPI unless specifically indicated above     Objective:    BP 122/69 (BP Location: Left Arm, Patient Position: Sitting, Cuff Size: Large)    Pulse 97   Temp (!) 97.1 F (36.2 C) (Oral)   Ht 5\' 5"  (1.651 m)   Wt 230 lb (104.3 kg)   SpO2 97%   BMI 38.27 kg/m   Wt Readings from Last 3 Encounters:  09/15/19 230 lb (104.3 kg)  08/14/18 211 lb 6.4 oz (95.9 kg)  02/13/18 224 lb 9.6 oz (101.9 kg)    Physical Exam Vitals reviewed.  Constitutional:      General: She is not in acute distress.    Appearance: Normal appearance. She is well-groomed. She is obese. She is not ill-appearing or toxic-appearing.  HENT:     Head: Normocephalic.  Eyes:     General: Lids are normal. Vision grossly intact.        Right eye: No discharge.        Left eye: No discharge.     Extraocular Movements: Extraocular movements intact.     Conjunctiva/sclera: Conjunctivae normal.     Pupils: Pupils are equal, round, and reactive to light.  Cardiovascular:     Rate and Rhythm: Normal rate and regular rhythm.     Pulses: Normal pulses.          Dorsalis pedis pulses are 2+ on the right side and 2+ on the left side.       Posterior tibial pulses are 2+ on the right side and 2+  on the left side.     Heart sounds: Normal heart sounds. No murmur. No friction rub. No gallop.   Pulmonary:     Effort: Pulmonary effort is normal. No respiratory distress.     Breath sounds: Normal breath sounds.  Abdominal:     General: Bowel sounds are normal. There is no distension.     Palpations: Abdomen is soft. There is no mass.     Tenderness: There is no abdominal tenderness. There is no guarding.  Musculoskeletal:     Right lower leg: No edema.     Left lower leg: No edema.  Skin:    General: Skin is warm and dry.     Capillary Refill: Capillary refill takes less than 2 seconds.  Neurological:     General: No focal deficit present.     Mental Status: She is alert and oriented to person, place, and time.     Cranial Nerves: Cranial nerves are intact. No cranial nerve deficit.     Sensory: Sensation is intact. No sensory deficit.     Motor: Motor function is  intact. No weakness.     Coordination: Coordination is intact.     Gait: Gait is intact. Gait normal.  Psychiatric:        Attention and Perception: Attention and perception normal.        Mood and Affect: Mood and affect normal.        Speech: Speech normal.        Behavior: Behavior normal. Behavior is cooperative.        Thought Content: Thought content normal.        Cognition and Memory: Cognition and memory normal.        Judgment: Judgment normal.    Results for orders placed or performed in visit on 08/14/18  Lipid panel  Result Value Ref Range   Cholesterol 280 (H) <200 mg/dL   HDL 78 >15 mg/dL   Triglycerides 945 <859 mg/dL   LDL Cholesterol (Calc) 176 (H) mg/dL (calc)   Total CHOL/HDL Ratio 3.6 <5.0 (calc)   Non-HDL Cholesterol (Calc) 202 (H) <130 mg/dL (calc)  COMPLETE METABOLIC PANEL WITH GFR  Result Value Ref Range   Glucose, Bld 94 65 - 99 mg/dL   BUN 16 7 - 25 mg/dL   Creat 2.92 4.46 - 2.86 mg/dL   GFR, Est Non African American 70 > OR = 60 mL/min/1.42m2   GFR, Est African American 81 > OR = 60 mL/min/1.26m2   BUN/Creatinine Ratio NOT APPLICABLE 6 - 22 (calc)   Sodium 139 135 - 146 mmol/L   Potassium 4.8 3.5 - 5.3 mmol/L   Chloride 104 98 - 110 mmol/L   CO2 24 20 - 32 mmol/L   Calcium 9.3 8.6 - 10.2 mg/dL   Total Protein 6.8 6.1 - 8.1 g/dL   Albumin 3.9 3.6 - 5.1 g/dL   Globulin 2.9 1.9 - 3.7 g/dL (calc)   AG Ratio 1.3 1.0 - 2.5 (calc)   Total Bilirubin 0.5 0.2 - 1.2 mg/dL   Alkaline phosphatase (APISO) 71 33 - 115 U/L   AST 31 10 - 35 U/L   ALT 31 (H) 6 - 29 U/L      Assessment & Plan:   Problem List Items Addressed This Visit      Cardiovascular and Mediastinum   Essential hypertension - Primary    Blood pressure at goal today.  Currently taking Lisinopril 10mg  and tolerating it well.   Plan: 1)  Labs to be drawn today. 2) Continue Lisinopril 10mg  daily 3) Take blood pressure readings regularly and write in a log.  Bring that log to your next  appointment. 4) Heart healthy diet and to exercise every other day for 30 minutes per day, going no more than 2 days in a row without exercise. 5) We will see you back in 6 months for follow up       Relevant Medications   lisinopril (ZESTRIL) 10 MG tablet   Other Relevant Orders   CBC with Differential   Comprehensive Metabolic Panel (CMET)     Other   Hyperlipidemia    Patient with previous dx of Hyperlipidemia and not presently on any medications.  Plan: 1) Will check lipids and CMP today 2) Encouraged DASH/Mediterranean diet 3) Increase physical activity to 30 minutes per day, every other day, going no more than 2 days in a row without exercise. 4) Follow up in 6 months, will call if lipid levels elevated to discuss      Relevant Medications   lisinopril (ZESTRIL) 10 MG tablet   Other Relevant Orders   Lipid Profile   Anxiety    Patient currently stable on Escitalopram 10mg  daily.  Tolerating well and currently managing stress well.  Plan: 1) Continue physical activity and healthy lifestyle 2) Continue Escitalopram without changes 3) Follow up in 6 months      Relevant Medications   escitalopram (LEXAPRO) 10 MG tablet   Obesity   Relevant Orders   Lipid Profile   Thyroid Panel With TSH   HgB A1c      Meds ordered this encounter  Medications  . escitalopram (LEXAPRO) 10 MG tablet    Sig: TAKE 1 TABLET(10 MG) BY MOUTH DAILY    Dispense:  90 tablet    Refill:  3    Order Specific Question:   Supervising Provider    Answer:   [[id number]]  . lisinopril (ZESTRIL) 10 MG tablet    Sig: TAKE 1 TABLET(10 MG) BY MOUTH DAILY    Dispense:  90 tablet    Refill:  3    Order Specific Question:   Supervising Provider    Answer:   [[id number]]      Follow up plan: Return in about 6 months (around 03/14/2020) for HTN, Hyperlipidemia F/U.   Smitty Cords, FNP Family Nurse Practitioner [facility] 09/15/2019, 10:01 AM

## 2019-09-15 NOTE — Assessment & Plan Note (Signed)
Patient currently stable on Escitalopram 10mg  daily.  Tolerating well and currently managing stress well.  Plan: 1) Continue physical activity and healthy lifestyle 2) Continue Escitalopram without changes 3) Follow up in 6 months

## 2019-09-15 NOTE — Patient Instructions (Signed)
Try to get exercise a minimum of 30 minutes per day at least 5 days per week as well as  adequate water intake all while measuring blood pressure a few times per week.  Keep a blood pressure log and bring back to clinic at your next visit.  If your readings are consistently over 140/90 to contact our office/send me a MyChart message and we will see you sooner.  Can try DASH and Mediterranean diet options, avoiding processed foods, lowering sodium intake, avoiding pork products, and eating a plant based diet for optimal health.  Having high HDL (good) cholesterol and low triglyceride levels can reduce your risk of heart attack and stroke.  The following steps can be taken to reduce triglyceride levels and increase HDL (good) cholesterol levels.  Avoid or limit alcohol Alcohol significantly raises triglyceride levels.  If your triglycerides are higher than 150, avoid or limit alcohol.  Limit fruit juice and dried fruits Even fresh fruit juice contains a large amount of fructose sugar and can increase triglyceride and blood sugar levels.  Fruit juice and dried fruit are also high in calories and can add unwanted pounds.  Replace fruit juice and dried fruits with fresh fruit that has more fiber and fewer calories.  Limit non-diet soft drinks and sports drinks Non-diet soft drinks and sport drinks contain as many as 12 teaspoons of sugar per serving.  This sugar can increase triglyceride and blood sugar levels.  Non-diet soft drinks and sport drinks also contain calories that add unwanted pounds.  Replace non-diet soft drinks and sports drinks with water, unsweetened tea, diet colas or beverages sweetened with Nutra-Sweet.  Limit concentrated sweets Even fat free or low fat sweets can raise your triglyceride and/or blood sugar levels.  Concentrated sweets include sugar, honey, jelly, candy, cookies, cakes, pies, pastries, frozen desserts, puddings, and sugar sweetened cereals.  Replace concentrated  sweets with high fiber foods such as fruit, low fat yogurt, sugar free gelatin and low fat puddings.  Limit refined carbohydrates Refined carbohydrates include white flour, white bread, white rice, and some pasta.  Limit portion sizes of these foods or replace them with whole wheat bread, lentils, whole grains, brown rice, and spinach or whole-wheat pastas.  Reduce your weight, if needed Even a 5-10 pound weight loss can cause your triglyceride level to decrease significantly.  You can lose 1-2 pounds per week by limiting your portion sizes and exercising 5-6 times per week.  Include monounsaturated fats in your diet.  Include Monounsaturated fats in your diet Moderate amounts of monounsaturated fat can raise your HDL (good) cholesterol.  Sources of monounsaturated fat include olive oil, canola oil, peanut oil, peanuts, peanut butter, cashews, olives, and avocados.  Choose peanut butter that does not have sugar in the ingredient list.  Since these foods are high in calories, serving sizes should be moderate.  Include fish in your diet Fish is low in saturated fat and rich in Omega-3 fatty acids.  Good choices include mackerel, salmon, herring, bluefish, lake trout, tuna, and sardines canned in oil.  If you smoke, quit Smoking lowers HDL (good) cholesterol and raises triglycerides.  When you quit smoking your HDL (good) cholesterol increases and triglycerides decrease.  Stay active Exercise raises your HDL (good) cholesterol.  Walk, ride a bike, or swim for at least 20 minutes, 3-5 times per week.  Ideally, you should try to exercise for 30-60 minutes most days of the week.  Check with your Healthcare Provider before beginning an exercise  program.  Mood  The following recommendations are helpful adjuncts for helping rebalance your mood.  Eat a nourishing diet. Ensure adequate intake of calories, protein, carbs, fat, vitamins, and minerals. Prioritize whole foods at each meal, including meats,  vegetables, fruits, nuts and seeds, etc.   Avoid inflammatory and/or "junk" foods, such as sugar, omega-6 fats, refined grains, chemicals, and preservatives are common in packaged and prepared foods. Minimize or completely avoid these ingredients and stick to whole foods with little to no additives. Cook from scratch as much as possible for more control over what you eat  Get enough sleep. Poor sleep is significantly associated with depression and anxiety. Make 7-9 hours of sleep nightly a top priority  Exercise appropriately. Exercise is known to improve brain functioning and boost mood. Aim for 30 minutes of daily physical activity. Avoid "overtraining," which can cause mental disturbances  Assess your light exposure. Not enough natural light during the day and too much artificial light can have a major impact on your mood. Get outside as often as possible during daylight hours. Minimize light exposure after dark and avoid the use of electronics that give off blue light before bed  Manage your stress.  Use daily stress management techniques such as meditation, yoga, or mindfulness to retrain your brain to respond differently to stress. Try deep breathing to deactivate your "fight or flight" response.  There are many of sources with apps like Headspace, Calm or a variety of YouTube videos (videos from [employer] have guided meditation)  Prioritize your social life. Work on building social support with new friends or improve current relationships. Consider getting a pet that allows for companionship, social interaction, and physical touch. Try volunteering or joining a faith-based community to increase your sense of purpose  Take time to play Unstructured "play" time can help reduce anxiety and depression Options for play include music, games, sports, dance, art, etc.  Try to add daily omega 3 fatty acids, magnesium, B complex, and balanced amino acid supplements to help improve mood and  anxiety.  You will receive a survey after today's visit either digitally by e-mail or paper by [employer]. Your experiences and feedback matter to [place].  Please respond so we know how we are doing as we provide care for you.  Call us with any questions/concerns/needs.  It is my goal to be available to you for your health concerns.  Thanks for choosing me to be a partner in your healthcare needs!  Charlaine Dalton, FNP-C Family Nurse Practitioner [facility] Phone: [phone]

## 2019-09-15 NOTE — Assessment & Plan Note (Signed)
Blood pressure at goal today.  Currently taking Lisinopril 10mg  and tolerating it well.   Plan: 1) Labs to be drawn today. 2) Continue Lisinopril 10mg  daily 3) Take blood pressure readings regularly and write in a log.  Bring that log to your next appointment. 4) Heart healthy diet and to exercise every other day for 30 minutes per day, going no more than 2 days in a row without exercise. 5) We will see you back in 6 months for follow up

## 2019-09-15 NOTE — Assessment & Plan Note (Signed)
Patient with previous dx of Hyperlipidemia and not presently on any medications.  Plan: 1) Will check lipids and CMP today 2) Encouraged DASH/Mediterranean diet 3) Increase physical activity to 30 minutes per day, every other day, going no more than 2 days in a row without exercise. 4) Follow up in 6 months, will call if lipid levels elevated to discuss

## 2019-09-15 NOTE — Progress Notes (Signed)
I have reviewed this encounter including the documentation in this note and/or discussed this patient with the provider, Nicole Malfi FNP. I am certifying that I agree with the content of this note as supervising physician.  Pearlie Lafosse, DO [facility] 09/15/2019, 1:12 PM  

## 2019-09-16 ENCOUNTER — Telehealth: Payer: Self-pay | Admitting: Family Medicine

## 2019-09-16 ENCOUNTER — Other Ambulatory Visit: Payer: Self-pay | Admitting: Family Medicine

## 2019-09-16 DIAGNOSIS — E782 Mixed hyperlipidemia: Secondary | ICD-10-CM

## 2019-09-16 LAB — LIPID PANEL
Cholesterol: 262 mg/dL — ABNORMAL HIGH (ref <200)
HDL: 78 mg/dL (ref >=50)
LDL Cholesterol (Calc): 147 mg/dL (calc) — ABNORMAL HIGH
Non-HDL Cholesterol (Calc): 184 mg/dL (calc) — ABNORMAL HIGH (ref <130)
Total CHOL/HDL Ratio: 3.4 (calc) (ref <5.0)
Triglycerides: 231 mg/dL — ABNORMAL HIGH (ref <150)

## 2019-09-16 LAB — CBC WITH DIFFERENTIAL/PLATELET
Absolute Monocytes: 556 cells/uL (ref 200–950)
Basophils Absolute: 91 cells/uL (ref 0–200)
Basophils Relative: 0.9 %
Eosinophils Absolute: 667 cells/uL — ABNORMAL HIGH (ref 15–500)
Eosinophils Relative: 6.6 %
HCT: 41.8 % (ref 35.0–45.0)
Hemoglobin: 14.3 g/dL (ref 11.7–15.5)
Lymphs Abs: 2475 cells/uL (ref 850–3900)
MCH: 34.1 pg — ABNORMAL HIGH (ref 27.0–33.0)
MCHC: 34.2 g/dL (ref 32.0–36.0)
MCV: 99.8 fL (ref 80.0–100.0)
MPV: 9.4 fL (ref 7.5–12.5)
Monocytes Relative: 5.5 %
Neutro Abs: 6313 cells/uL (ref 1500–7800)
Neutrophils Relative %: 62.5 %
Platelets: 328 10*3/uL (ref 140–400)
RBC: 4.19 10*6/uL (ref 3.80–5.10)
RDW: 12.9 % (ref 11.0–15.0)
Total Lymphocyte: 24.5 %
WBC: 10.1 10*3/uL (ref 3.8–10.8)

## 2019-09-16 LAB — COMPREHENSIVE METABOLIC PANEL
AG Ratio: 1.3 (calc) (ref 1.0–2.5)
ALT: 16 U/L (ref 6–29)
AST: 21 U/L (ref 10–35)
Albumin: 3.9 g/dL (ref 3.6–5.1)
Alkaline phosphatase (APISO): 95 U/L (ref 31–125)
BUN: 12 mg/dL (ref 7–25)
CO2: 25 mmol/L (ref 20–32)
Calcium: 9.4 mg/dL (ref 8.6–10.2)
Chloride: 99 mmol/L (ref 98–110)
Creat: 0.9 mg/dL (ref 0.50–1.10)
Globulin: 2.9 g/dL (calc) (ref 1.9–3.7)
Glucose, Bld: 99 mg/dL (ref 65–99)
Potassium: 4.4 mmol/L (ref 3.5–5.3)
Sodium: 135 mmol/L (ref 135–146)
Total Bilirubin: 0.4 mg/dL (ref 0.2–1.2)
Total Protein: 6.8 g/dL (ref 6.1–8.1)

## 2019-09-16 LAB — HEMOGLOBIN A1C
Hgb A1c MFr Bld: 5.4 % of total Hgb (ref <5.7)
Mean Plasma Glucose: 108 (calc)
eAG (mmol/L): 6 (calc)

## 2019-09-16 LAB — THYROID PANEL WITH TSH
Free Thyroxine Index: 2.1 (ref 1.4–3.8)
T3 Uptake: 25 % (ref 22–35)
T4, Total: 8.5 ug/dL (ref 5.1–11.9)
TSH: 1.44 mIU/L

## 2019-09-16 MED ORDER — ROSUVASTATIN CALCIUM 5 MG PO TABS: 5.0000 mg | ORAL_TABLET | Freq: Every day | ORAL | 3 refills | Status: DC

## 2019-09-16 NOTE — Telephone Encounter (Signed)
Hannah Singleton returned call, defer to result note for labs.

## 2019-09-16 NOTE — Progress Notes (Signed)
Spoke with Hannah Singleton, verbalized understanding of elevated lipid levels and uncontrolled hyperlipidemia with increased risk for heart attack and stroke.  Agreeable to trying a statin to help with lowering her lipids.  Will send in Rosuvastatin and patient will touch base with me if has any concerns, questions, needs.

## 2019-11-12 ENCOUNTER — Encounter: Payer: Self-pay | Admitting: Physician Assistant

## 2019-11-12 ENCOUNTER — Telehealth: Payer: BC Managed Care – PPO | Admitting: Physician Assistant

## 2019-11-12 DIAGNOSIS — J019 Acute sinusitis, unspecified: Secondary | ICD-10-CM

## 2019-11-12 MED ORDER — AMOXICILLIN-POT CLAVULANATE 875-125 MG PO TABS: 1.0000 | ORAL_TABLET | Freq: Two times a day (BID) | ORAL | 0 refills | Status: DC

## 2019-11-12 NOTE — Progress Notes (Signed)
We are sorry that you are not feeling well.  Here is how we plan to help!  Based on what you have shared with me it looks like you have sinusitis.  Sinusitis is inflammation and infection in the sinus cavities of the head.  Based on your presentation I believe you most likely have Acute Bacterial Sinusitis.  This is an infection caused by bacteria and is treated with antibiotics. I have prescribed Augmentin 875mg/125mg one tablet twice daily with food, for 7 days. You may use an oral decongestant such as Mucinex D or if you have glaucoma or high blood pressure use plain Mucinex. Saline nasal spray help and can safely be used as often as needed for congestion.  If you develop worsening sinus pain, fever or notice severe headache and vision changes, or if symptoms are not better after completion of antibiotic, please schedule an appointment with a health care provider.    Sinus infections are not as easily transmitted as other respiratory infection, however we still recommend that you avoid close contact with loved ones, especially the very young and elderly.  Remember to wash your hands thoroughly throughout the day as this is the number one way to prevent the spread of infection!  Home Care:  Only take medications as instructed by your medical team.  Complete the entire course of an antibiotic.  Do not take these medications with alcohol.  A steam or ultrasonic humidifier can help congestion.  You can place a towel over your head and breathe in the steam from hot water coming from a faucet.  Avoid close contacts especially the very young and the elderly.  Cover your mouth when you cough or sneeze.  Always remember to wash your hands.  Get Help Right Away If:  You develop worsening fever or sinus pain.  You develop a severe head ache or visual changes.  Your symptoms persist after you have completed your treatment plan.  Make sure you  Understand these instructions.  Will watch your  condition.  Will get help right away if you are not doing well or get worse.  Your e-visit answers were reviewed by a board certified advanced clinical practitioner to complete your personal care plan.  Depending on the condition, your plan could have included both over the counter or prescription medications.  If there is a problem please reply  once you have received a response from your provider.  Your safety is important to us.  If you have drug allergies check your prescription carefully.    You can use MyChart to ask questions about today's visit, request a non-urgent call back, or ask for a work or school excuse for 24 hours related to this e-Visit. If it has been greater than 24 hours you will need to follow up with your provider, or enter a new e-Visit to address those concerns.  You will get an e-mail in the next two days asking about your experience.  I hope that your e-visit has been valuable and will speed your recovery. Thank you for using e-visits.   I spent 5-10 minutes on review and completion of this note- Verenice Westrich PAC  

## 2019-11-16 ENCOUNTER — Other Ambulatory Visit: Payer: Self-pay | Admitting: Family Medicine

## 2019-11-16 DIAGNOSIS — F419 Anxiety disorder, unspecified: Secondary | ICD-10-CM

## 2019-11-16 NOTE — Telephone Encounter (Signed)
Last ordered on 09/15/19 with 90 tabs and 3 refills. Too early to refill. Should have enough until November.

## 2019-11-17 ENCOUNTER — Other Ambulatory Visit: Payer: Self-pay | Admitting: Family Medicine

## 2019-11-17 DIAGNOSIS — I10 Essential (primary) hypertension: Secondary | ICD-10-CM

## 2020-01-12 ENCOUNTER — Other Ambulatory Visit: Payer: Self-pay | Admitting: Obstetrics and Gynecology

## 2020-01-12 DIAGNOSIS — Z1231 Encounter for screening mammogram for malignant neoplasm of breast: Secondary | ICD-10-CM

## 2020-02-18 ENCOUNTER — Ambulatory Visit
Admission: RE | Admit: 2020-02-18 | Discharge: 2020-02-18 | Disposition: A | Discharge status: Home / Self Care | Payer: BC Managed Care – PPO | Source: Ambulatory Visit | Attending: Obstetrics and Gynecology | Admitting: Obstetrics and Gynecology

## 2020-02-18 ENCOUNTER — Other Ambulatory Visit: Payer: Self-pay

## 2020-02-18 DIAGNOSIS — Z1231 Encounter for screening mammogram for malignant neoplasm of breast: Secondary | ICD-10-CM | POA: Insufficient documentation

## 2020-05-08 ENCOUNTER — Telehealth: Payer: BC Managed Care – PPO | Admitting: Nurse Practitioner

## 2020-05-08 DIAGNOSIS — H938X1 Other specified disorders of right ear: Secondary | ICD-10-CM

## 2020-05-08 NOTE — Progress Notes (Signed)
Based on what you shared with me it looks like you have ear stopped up,that should be evaluated in a face to face office visit. Since you have been taking amoxicillin and it has not helped you will need someone to look in your ear to see what is going on.    NOTE: If you entered your credit card information for this eVisit, you will not be charged. You may see a "hold" on your card for the $35 but that hold will drop off and you will not have a charge processed.  If you are having a true medical emergency please call 911.     For an urgent face to face visit, [place] has four urgent care centers for your convenience:   . [facility]    [phone]                  Get Driving Directions  [address] [place], [place] [address] . 10 am to 8 pm Monday-Friday . 12 pm to 8 pm Saturday-Sunday   . [facility] at [facility]  [phone]                  Get Driving Directions  [address], [address] [place], [place] [address] . 8 am to 8 pm Monday-Friday . 9 am to 6 pm Saturday . 11 am to 6 pm Sunday   . [facility] at [facility]  [phone]                  Get Driving Directions   [address] [place], [place] [address] . 8 am to 8 pm Monday-Friday . 8 am to 4 pm Saturday-Sunday    . [facility] at [facility] Directions  [phone]  [address], [address] [place], [place] [address]  . Monday-Friday, 12 PM to 6 PM    Your e-visit answers were reviewed by a board certified advanced clinical practitioner to complete your personal care plan.  Thank you for using e-Visits.

## 2020-05-09 ENCOUNTER — Encounter: Payer: Self-pay | Admitting: Family Medicine

## 2020-05-09 ENCOUNTER — Telehealth (INDEPENDENT_AMBULATORY_CARE_PROVIDER_SITE_OTHER): Payer: BC Managed Care – PPO | Admitting: Family Medicine

## 2020-05-09 ENCOUNTER — Other Ambulatory Visit: Payer: Self-pay

## 2020-05-09 DIAGNOSIS — H6981 Other specified disorders of Eustachian tube, right ear: Secondary | ICD-10-CM

## 2020-05-09 MED ORDER — GUAIFENESIN ER 600 MG PO TB12: 1200.0000 mg | ORAL_TABLET | Freq: Two times a day (BID) | ORAL | 0 refills | Status: AC

## 2020-05-09 MED ORDER — PREDNISONE 50 MG PO TABS: ORAL_TABLET | ORAL | 0 refills | Status: DC

## 2020-05-09 NOTE — Progress Notes (Signed)
Virtual Visit via MyChart Video Visit  The purpose of this virtual visit is to provide medical care while limiting exposure to the novel coronavirus (COVID19) for both patient and office staff.  Consent was obtained for phone visit:  Yes.   Answered questions that patient had about telehealth interaction:  Yes.   I discussed the limitations, risks, security and privacy concerns of performing an evaluation and management service by telephone. I also discussed with the patient that there may be a patient responsible charge related to this service. The patient expressed understanding and agreed to proceed.  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 Ear Pain    constant Rt ear earache x 2 day. Swollen area at the base of the Rt neck, denies any pain or discomfort at the site x 2 weeks. She took 2 doses of left over Augmentin see had at home.     S: Reviewed CMA documentation. I have called patient and gathered additional HPI as follows:  Hannah Singleton presents to clinic via MyChart Video Visit for concerns or right earache x 2 days with swelling at the base of the right neck x 2 weeks.  Has popping/clicking when swallowing.  Denies any discomfort at neck site of swelling.  Denies fevers, unintentional weight loss, rhinorrhea, nasal congestion, recent illness.  Patient is currently working  Denies any high risk travel to areas of current concern for COVID19. Denies any known or suspected exposure to person with or possibly with COVID19.  Past Medical History:  Diagnosis Date  . Anxiety   . Hyperlipidemia    Social History   Tobacco Use  . Smoking status: Former Smoker    Years: 3.00    Quit date: 04/08/2007    Years since quitting: 13.0  . Smokeless tobacco: Never Used   Vaping Use  . Vaping Use: Never used  Substance Use Topics  . Alcohol use: Yes    Alcohol/week: 8.0 standard drinks    Types: 8 Standard drinks or equivalent per week    Comment: no binge drinking  . Drug use: No    Current Outpatient Medications:  .  escitalopram (LEXAPRO) 10 MG tablet, TAKE 1 TABLET(10 MG) BY MOUTH DAILY, Disp: 90 tablet, Rfl: 3 .  lisinopril (ZESTRIL) 10 MG tablet, TAKE 1 TABLET(10 MG) BY MOUTH DAILY, Disp: 90 tablet, Rfl: 3 .  Multiple Vitamin (MULTIVITAMIN) tablet, Take 1 tablet by mouth daily., Disp: , Rfl:  .  rosuvastatin (CRESTOR) 5 MG tablet, Take 1 tablet (5 mg total) by mouth daily., Disp: 90 tablet, Rfl: 3 .  guaiFENesin (MUCINEX) 600 MG 12 hr tablet, Take 2 tablets (1,200 mg total) by mouth 2 (two) times daily for 10 days., Disp: 40 tablet, Rfl: 0 .  norethindrone-ethinyl estradiol (LOESTRIN) 1-20 MG-MCG tablet, Take by mouth., Disp: , Rfl:  .  predniSONE (DELTASONE) 50 MG tablet, Take 1 tablet daily x 5 days, Disp: 5 tablet, Rfl: 0  Depression screen [facility] 2/9 09/15/2019 08/14/2018 02/13/2018  Decreased Interest 0 0 0  Down, Depressed, Hopeless 0 0 0  PHQ - 2 Score 0 0 0  Altered sleeping 0 0 0  Tired, decreased energy 0 0 0  Change in appetite 0 0 0  Feeling bad or failure about yourself  0 0 0  Trouble concentrating 0 0 0  Moving slowly or  fidgety/restless 0 0 0  Suicidal thoughts 0 0 0  PHQ-9 Score 0 0 0  Difficult doing work/chores Not difficult at all Not difficult at all Not difficult at all    GAD 7 : Generalized Anxiety Score 09/15/2019 08/08/2017 04/07/2017  Nervous, Anxious, on Edge 0 1 0  Control/stop worrying 0 0 0  Worry too much - different things 0 1 1  Trouble relaxing 0 0 0  Restless 0 0 0  Easily annoyed or irritable 0 1 0  Afraid - awful might happen 0 0 0  Total GAD 7 Score 0 3 1  Anxiety Difficulty Not difficult at all Not difficult at all Not difficult at all     -------------------------------------------------------------------------- O: No physical exam performed due to remote telephone encounter.  Physical Exam: Patient remotely monitored with video.  Verbal communication appropriate.  Cognition normal.  No results found for this or any previous visit (from the past 2160 hour(s)).  -------------------------------------------------------------------------- A&P:  Problem List Items Addressed This Visit      Nervous and Auditory   Eustachian tube dysfunction, right - Primary    Right eustachian tube dysfunction, taking flonase currently with mild improvement in symptoms.  Will add mucinex 1200mg  BID x 10 days along with prednisone 50mg  daily x 5 days.  Plan: 1. Begin mucinex 1200mg  BID x 10 days 2. Begin prednisone 50mg  daily x 5 days 3. RTC PRN      Relevant Medications   guaiFENesin (MUCINEX) 600 MG 12 hr tablet   predniSONE (DELTASONE) 50 MG tablet      Meds ordered this encounter  Medications  . guaiFENesin (MUCINEX) 600 MG 12 hr tablet    Sig: Take 2 tablets (1,200 mg total) by mouth 2 (two) times daily for 10 days.    Dispense:  40 tablet    Refill:  0  . predniSONE (DELTASONE) 50 MG tablet    Sig: Take 1 tablet daily x 5 days    Dispense:  5 tablet    Refill:  0    Follow-up: - Return if symptoms worsen or fail to improve  Patient verbalizes understanding with the above medical recommendations including the limitation of remote medical advice.  Specific follow-up and call-back criteria were given for patient to follow-up or seek medical care more urgently if needed.  - Time spent in direct consultation with patient on video: 7 minutes  , FNP-C [facility] 05/09/2020, 11:40 AM

## 2020-05-09 NOTE — Assessment & Plan Note (Signed)
Right eustachian tube dysfunction, taking flonase currently with mild improvement in symptoms.  Will add mucinex 1200mg  BID x 10 days along with prednisone 50mg  daily x 5 days.  Plan: 1. Begin mucinex 1200mg  BID x 10 days 2. Begin prednisone 50mg  daily x 5 days 3. RTC PRN

## 2020-08-03 ENCOUNTER — Other Ambulatory Visit: Payer: BC Managed Care – PPO

## 2020-08-21 ENCOUNTER — Other Ambulatory Visit: Payer: Self-pay

## 2020-08-21 DIAGNOSIS — E782 Mixed hyperlipidemia: Secondary | ICD-10-CM

## 2020-08-21 MED ORDER — ROSUVASTATIN CALCIUM 5 MG PO TABS
5.0000 mg | ORAL_TABLET | Freq: Every day | ORAL | 3 refills | Status: AC
Start: 2020-08-21 — End: ?

## 2020-11-22 ENCOUNTER — Other Ambulatory Visit: Payer: Self-pay

## 2021-01-31 ENCOUNTER — Other Ambulatory Visit: Payer: Self-pay | Admitting: Obstetrics and Gynecology

## 2021-01-31 DIAGNOSIS — Z1231 Encounter for screening mammogram for malignant neoplasm of breast: Secondary | ICD-10-CM

## 2021-02-21 ENCOUNTER — Ambulatory Visit
Admission: RE | Admit: 2021-02-21 | Discharge: 2021-02-21 | Disposition: A | Discharge status: Home / Self Care | Payer: BC Managed Care – PPO | Source: Ambulatory Visit | Attending: Obstetrics and Gynecology | Admitting: Obstetrics and Gynecology

## 2021-02-21 ENCOUNTER — Other Ambulatory Visit: Payer: Self-pay

## 2021-02-21 DIAGNOSIS — Z1231 Encounter for screening mammogram for malignant neoplasm of breast: Secondary | ICD-10-CM | POA: Diagnosis not present

## 2021-04-24 DIAGNOSIS — K7581 Nonalcoholic steatohepatitis (NASH): Secondary | ICD-10-CM | POA: Insufficient documentation

## 2021-08-06 ENCOUNTER — Other Ambulatory Visit: Payer: Self-pay | Admitting: Family Medicine

## 2021-08-06 DIAGNOSIS — E782 Mixed hyperlipidemia: Secondary | ICD-10-CM

## 2021-08-06 NOTE — Telephone Encounter (Signed)
Requested medication (s) are due for refill today: yes  Requested medication (s) are on the active medication list: yes  Last refill:  08/21/20 #90/3  Future visit scheduled: no  Notes to clinic:  pt is overdue for labs and hasnt had OV since 07/2020. Please advise     Requested Prescriptions  Pending Prescriptions Disp Refills   rosuvastatin (CRESTOR) 5 MG tablet [Pharmacy Med Name: ROSUVASTATIN 5MG  TABLETS] 90 tablet 3    Sig: TAKE 1 TABLET(5 MG) BY MOUTH DAILY     Cardiovascular:  Antilipid - Statins Failed - 08/06/2021  7:11 AM      Failed - Total Cholesterol in normal range and within 360 days    Cholesterol  Date Value Ref Range Status  09/15/2019 262 (H) <200 mg/dL Final          Failed - LDL in normal range and within 360 days    LDL Cholesterol (Calc)  Date Value Ref Range Status  09/15/2019 147 (H) mg/dL (calc) Final    Comment:    Reference range: <100 . Desirable range <100 mg/dL for primary prevention;   <70 mg/dL for patients with CHD or diabetic patients  with > or = 2 CHD risk factors. Marland Kitchen LDL-C is now calculated using the Martin-Hopkins  calculation, which is a validated novel method providing  better accuracy than the Friedewald equation in the  estimation of LDL-C.  Cresenciano Genre et al. Annamaria Helling. [id number]): 2061-2068  (http://education.[employer].com/faq/FAQ164)           Failed - HDL in normal range and within 360 days    HDL  Date Value Ref Range Status  09/15/2019 78 > OR = 50 mg/dL Final          Failed - Triglycerides in normal range and within 360 days    Triglycerides  Date Value Ref Range Status  09/15/2019 231 (H) <150 mg/dL Final    Comment:    . If a non-fasting specimen was collected, consider repeat triglyceride testing on a fasting specimen if clinically indicated.  Yates Decamp et al. J. of Clin. Lipidol. [id number]. .           Failed - Valid encounter within last 12 months    Recent Outpatient Visits           1  year ago Eustachian tube dysfunction, right   [place], FNP   1 year ago Essential hypertension   [place], [place]   2 years ago Essential hypertension   [facility] [employer], NP   3 years ago Essential hypertension   [facility] [employer], NP   3 years ago Cough   [place], Jerrel Ivory, NP              Passed - Patient is not pregnant

## 2021-08-31 ENCOUNTER — Telehealth: Payer: BC Managed Care – PPO | Admitting: Family Medicine

## 2021-08-31 DIAGNOSIS — B9689 Other specified bacterial agents as the cause of diseases classified elsewhere: Secondary | ICD-10-CM | POA: Diagnosis not present

## 2021-08-31 DIAGNOSIS — J019 Acute sinusitis, unspecified: Secondary | ICD-10-CM

## 2021-08-31 MED ORDER — AMOXICILLIN-POT CLAVULANATE 875-125 MG PO TABS: 1.0000 | ORAL_TABLET | Freq: Two times a day (BID) | ORAL | 0 refills | Status: DC

## 2021-08-31 MED ORDER — BENZONATATE 100 MG PO CAPS
100.0000 mg | ORAL_CAPSULE | Freq: Two times a day (BID) | ORAL | 0 refills | Status: DC | PRN
Start: 2021-08-31 — End: 2022-11-27

## 2021-08-31 MED ORDER — FLUTICASONE PROPIONATE 50 MCG/ACT NA SUSP: 2.0000 | Freq: Every day | NASAL | 0 refills | Status: DC

## 2021-08-31 NOTE — Progress Notes (Signed)
E-Visit for Sinus Problems  We are sorry that you are not feeling well.  Here is how we plan to help!  Based on what you have shared with me it looks like you have sinusitis.  Sinusitis is inflammation and infection in the sinus cavities of the head.  Based on your presentation I believe you most likely have Acute Bacterial Sinusitis.  This is an infection caused by bacteria and is treated with antibiotics. I have prescribed Augmentin 875mg /125mg  one tablet twice daily with food, for 7 days. You may use an oral decongestant such as Mucinex D or if you have glaucoma or high blood pressure use plain Mucinex. Saline nasal spray help and can safely be used as often as needed for congestion.  If you develop worsening sinus pain, fever or notice severe headache and vision changes, or if symptoms are not better after completion of antibiotic, please schedule an appointment with a health care provider.    I have also sent in cough medication. And Flonase If you do not improve please follow up in person.  Sinus infections are not as easily transmitted as other respiratory infection, however we still recommend that you avoid close contact with loved ones, especially the very young and elderly.  Remember to wash your hands thoroughly throughout the day as this is the number one way to prevent the spread of infection!  Home Care: Only take medications as instructed by your medical team. Complete the entire course of an antibiotic. Do not take these medications with alcohol. A steam or ultrasonic humidifier can help congestion.  You can place a towel over your head and breathe in the steam from hot water coming from a faucet. Avoid close contacts especially the very young and the elderly. Cover your mouth when you cough or sneeze. Always remember to wash your hands.  Get Help Right Away If: You develop worsening fever or sinus pain. You develop a severe head ache or visual changes. Your symptoms persist  after you have completed your treatment plan.  Make sure you Understand these instructions. Will watch your condition. Will get help right away if you are not doing well or get worse.  Thank you for choosing an e-visit.  Your e-visit answers were reviewed by a board certified advanced clinical practitioner to complete your personal care plan. Depending upon the condition, your plan could have included both over the counter or prescription medications.  Please review your pharmacy choice. Make sure the pharmacy is open so you can pick up prescription now. If there is a problem, you may contact your provider through and have the prescription routed to another pharmacy.  Your safety is important to [employer]. If you have drug allergies check your prescription carefully.   For the next 24 hours you can use MyChart to ask questions about today's visit, request a non-urgent call back, or ask for a work or school excuse. You will get an email in the next two days asking about your experience. I hope that your e-visit has been valuable and will speed your recovery.  I provided 5 minutes of non face-to-face time during this encounter for chart review, medication and order placement, as well as and documentation.

## 2022-02-12 ENCOUNTER — Other Ambulatory Visit: Payer: Self-pay | Admitting: Obstetrics and Gynecology

## 2022-02-12 DIAGNOSIS — Z1231 Encounter for screening mammogram for malignant neoplasm of breast: Secondary | ICD-10-CM

## 2022-03-06 ENCOUNTER — Ambulatory Visit
Admission: RE | Admit: 2022-03-06 | Discharge: 2022-03-06 | Disposition: A | Discharge status: Home / Self Care | Payer: BC Managed Care – PPO | Source: Ambulatory Visit | Attending: Obstetrics and Gynecology | Admitting: Obstetrics and Gynecology

## 2022-03-06 ENCOUNTER — Encounter: Payer: Self-pay | Admitting: Radiology

## 2022-03-06 DIAGNOSIS — Z1231 Encounter for screening mammogram for malignant neoplasm of breast: Secondary | ICD-10-CM | POA: Diagnosis present

## 2022-05-26 IMAGING — MG MM DIGITAL SCREENING BILAT W/ TOMO AND CAD
8 series · 8 of 24 positions shown · non-contrast
Comparison: Previous exam(s).

ACR Breast Density Category a: The breast tissue is almost entirely
fatty.

CLINICAL DATA: Screening.

EXAM:
DIGITAL SCREENING BILATERAL MAMMOGRAM WITH TOMOSYNTHESIS AND CAD
TECHNIQUE: Bilateral screening digital craniocaudal and mediolateral oblique
mammograms were obtained. Bilateral screening digital breast
tomosynthesis was performed. The images were evaluated with
computer-aided detection.

[R MLO synth-2D]
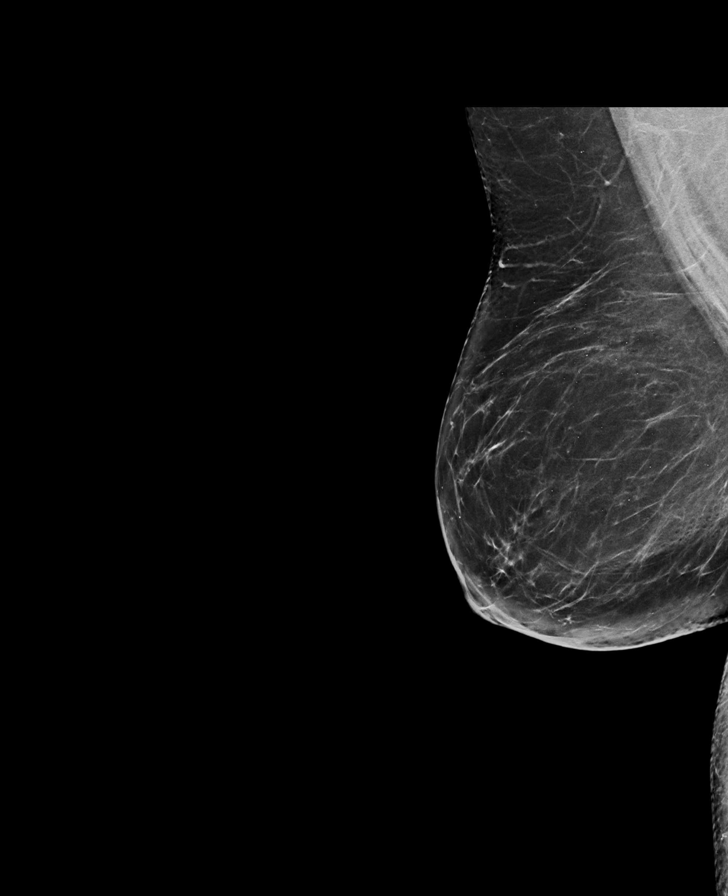

[L MLO synth-2D]
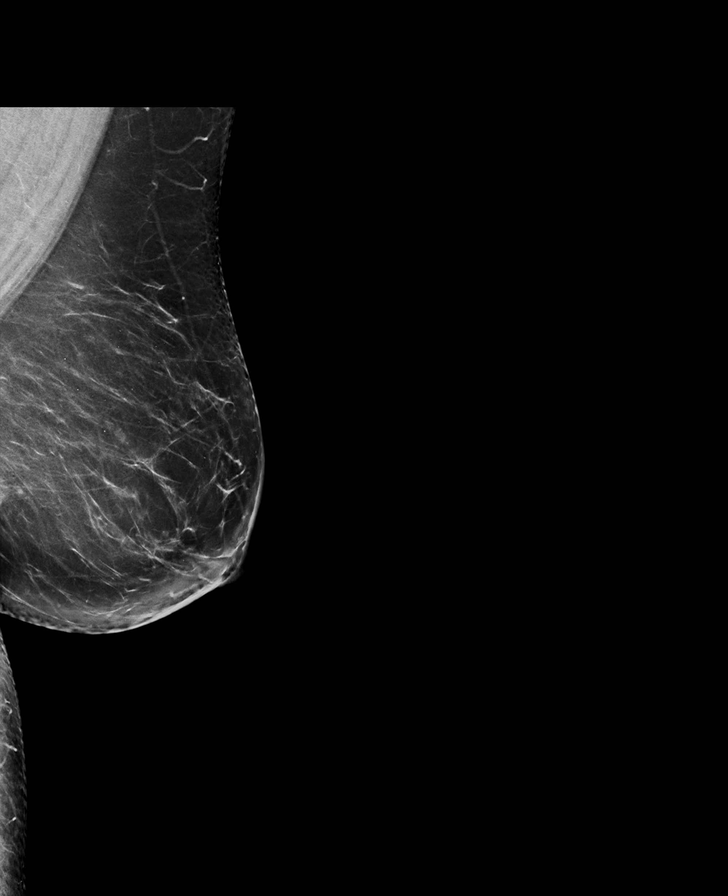

[R CC synth-2D]
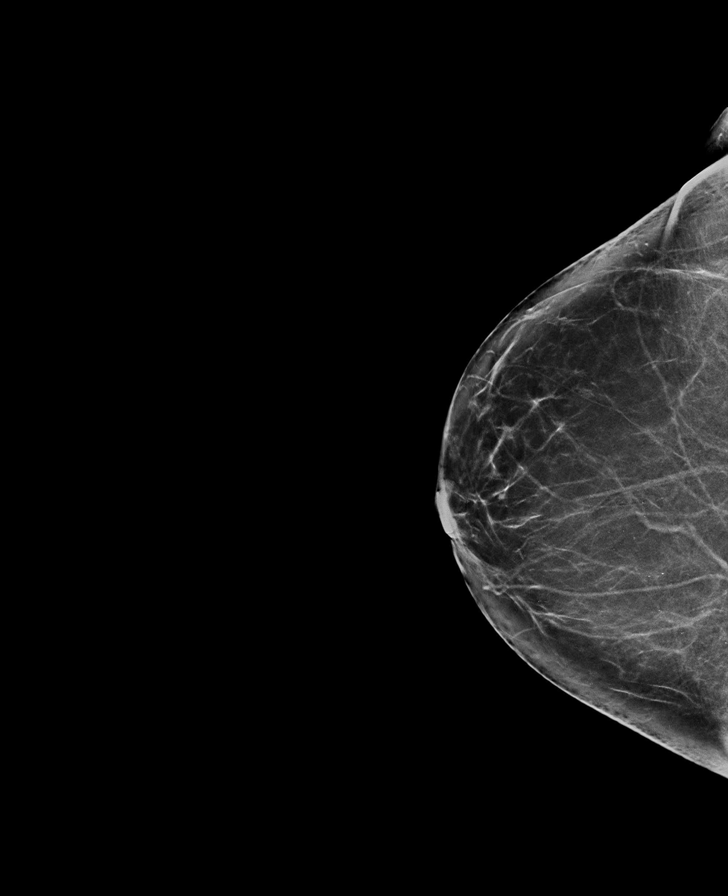

[L CC synth-2D]
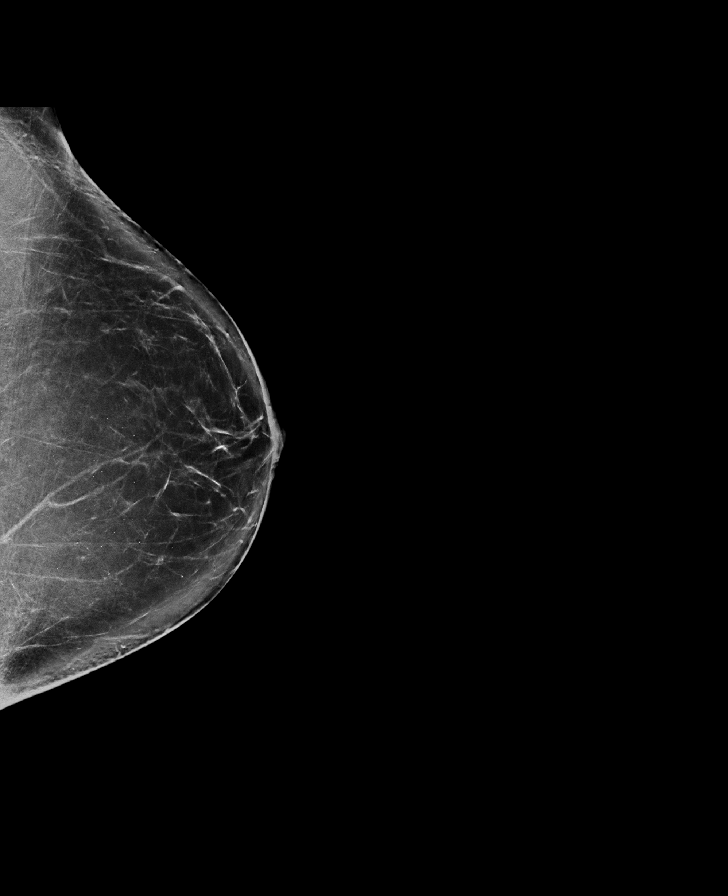

[L MLO tomo · tomo slice 41/82.0]
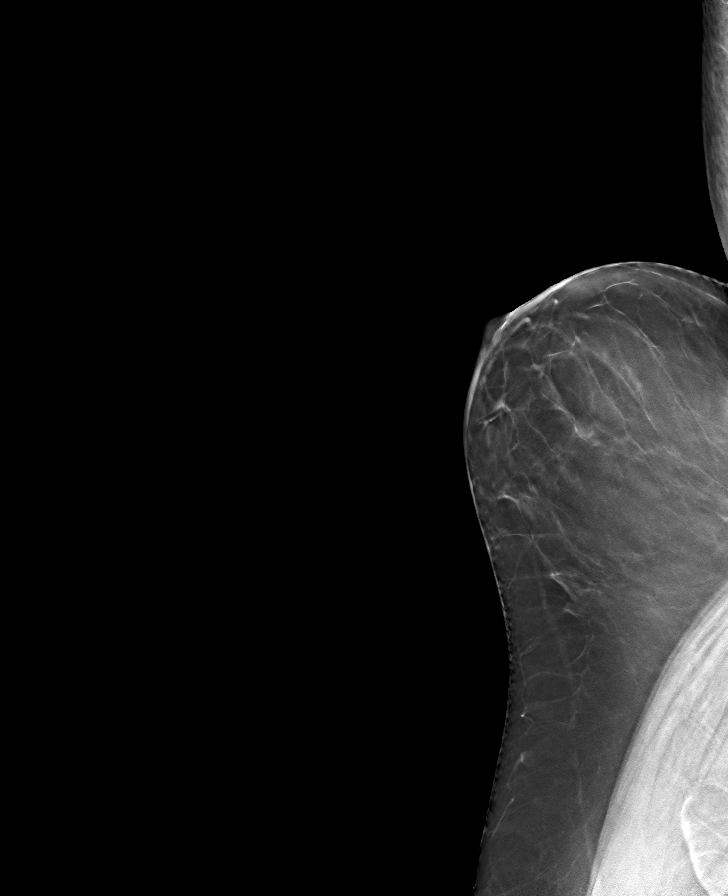

[R CC tomo · tomo slice 37/73.0]
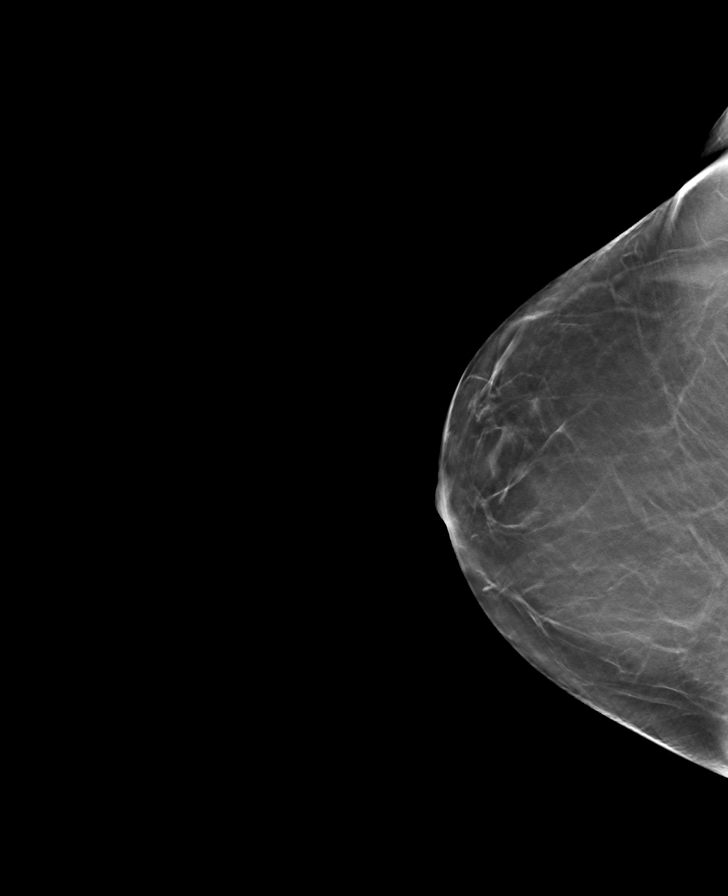

[R MLO tomo · tomo slice 41/81.0]
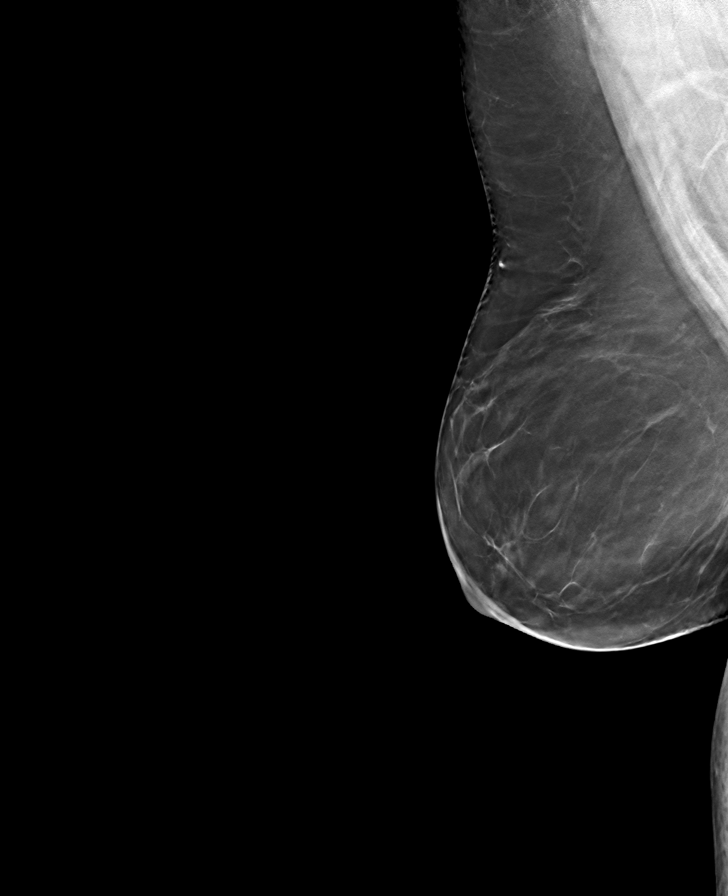

[L CC tomo · tomo slice 39/78.0]
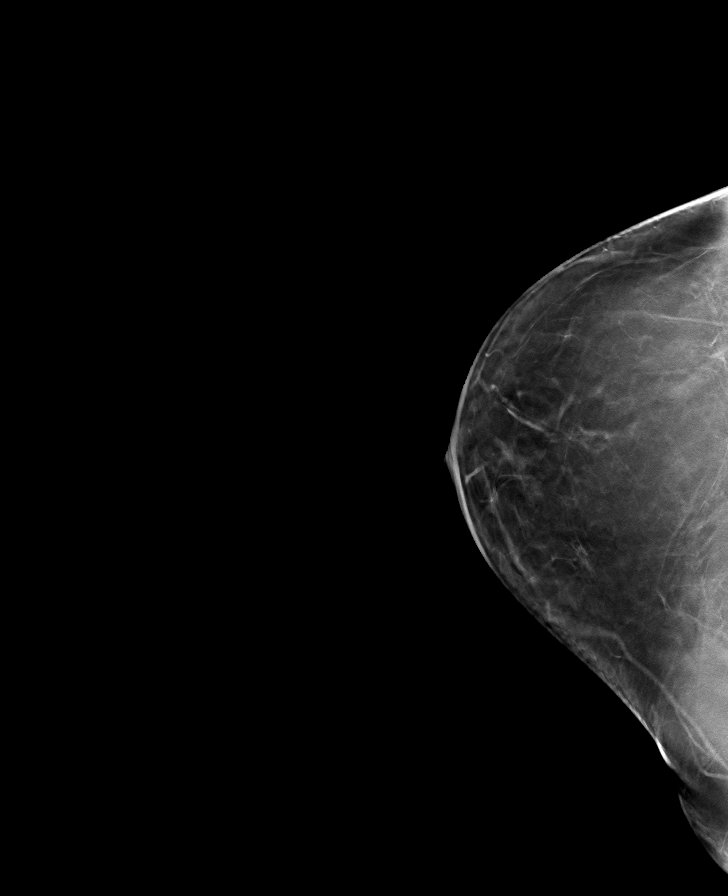

[8 of 24 positions shown; findings below may reference images not displayed]

FINDINGS: There are no findings suspicious for malignancy.
IMPRESSION: No mammographic evidence of malignancy. A result letter of this
screening mammogram will be mailed directly to the patient.

RECOMMENDATION:
Screening mammogram in one year. (Code:0E-3-N98)

BI-RADS CATEGORY  1: Negative.

## 2022-11-27 ENCOUNTER — Ambulatory Visit (INDEPENDENT_AMBULATORY_CARE_PROVIDER_SITE_OTHER): Payer: BC Managed Care – PPO | Admitting: Family Medicine

## 2022-11-27 ENCOUNTER — Encounter: Payer: Self-pay | Admitting: Family Medicine

## 2022-11-27 VITALS — BP 131/86 | HR 99 | Ht 64.0 in | Wt 221.0 lb

## 2022-11-27 DIAGNOSIS — Z01419 Encounter for gynecological examination (general) (routine) without abnormal findings: Secondary | ICD-10-CM

## 2022-11-27 DIAGNOSIS — Z1231 Encounter for screening mammogram for malignant neoplasm of breast: Secondary | ICD-10-CM | POA: Diagnosis not present

## 2022-11-27 DIAGNOSIS — D259 Leiomyoma of uterus, unspecified: Secondary | ICD-10-CM | POA: Insufficient documentation

## 2022-11-27 NOTE — Progress Notes (Signed)
   Subjective:    Patient ID: Hannah Singleton is a 52 y.o. female presenting with Annual Exam  on 11/27/2022  HPI: Here to establish care. She is a G1P1001. She was previously a patient at [facility] and would like to establish care. She has no h/o abnormal pap. She is peri-menopausal. Has h/o fibroids and heavy cycles, but cycles are now light and spaced out. Having occasional hot flashes which does not bother her much.  The following portions of the patient's history were reviewed and updated as appropriate: allergies, current medications, past family history, past medical history, past social history, past surgical history, and problem list.  Review of Systems  Constitutional:  Negative for chills and fever.  Respiratory:  Negative for shortness of breath.   Cardiovascular:  Negative for chest pain.  Gastrointestinal:  Negative for abdominal pain, nausea and vomiting.  Genitourinary:  Negative for dysuria.  Skin:  Negative for rash.      Objective:    BP 131/86   Pulse 99   Ht 5\' 4"  (1.626 m)   Wt 221 lb (100.2 kg)   BMI 37.93 kg/m  Physical Exam Exam conducted with a chaperone present.  Constitutional:      General: She is not in acute distress.    Appearance: She is well-developed.  HENT:     Head: Normocephalic and atraumatic.  Eyes:     General: No scleral icterus. Cardiovascular:     Rate and Rhythm: Normal rate.  Pulmonary:     Effort: Pulmonary effort is normal.  Abdominal:     Palpations: Abdomen is soft.  Musculoskeletal:     Cervical back: Neck supple.  Skin:    General: Skin is warm and dry.  Neurological:     Mental Status: She is alert and oriented to person, place, and time.         Assessment & Plan:   Encounter for screening mammogram for malignant neoplasm of breast - ordered mammogram for August. - Plan: MM 3D SCREENING MAMMOGRAM BILATERAL BREAST Pap due next year.  Return in 1 year (on 11/27/2023).  Reva Bores, MD 11/27/2022 9:55 AM

## 2022-11-27 NOTE — Patient Instructions (Signed)

## 2022-11-27 NOTE — Progress Notes (Signed)
Patient presents for Annual.  LMP: 1 every 6-8 months  Last pap: Date: 2022 WNL- [facility]  Contraception: Perimenopausal  Mammogram: Up to date: 03/06/2022 STD Screening: Declines Flu Vaccine : N/A  CC: Annual Est care  Has been going to [facility] and wants to establish care here with [place].   Fun Fact: Loves the [employer]

## 2022-12-27 ENCOUNTER — Telehealth: Payer: BC Managed Care – PPO | Admitting: Nurse Practitioner

## 2022-12-27 DIAGNOSIS — J329 Chronic sinusitis, unspecified: Secondary | ICD-10-CM | POA: Diagnosis not present

## 2022-12-27 DIAGNOSIS — B9789 Other viral agents as the cause of diseases classified elsewhere: Secondary | ICD-10-CM | POA: Diagnosis not present

## 2022-12-27 MED ORDER — IPRATROPIUM BROMIDE 0.03 % NA SOLN: 2.0000 | Freq: Two times a day (BID) | NASAL | 12 refills | Status: AC

## 2022-12-27 NOTE — Progress Notes (Signed)
E-Visit for Sinus Problems  We are sorry that you are not feeling well.  Here is how we plan to help!  Based on what you have shared with me it looks like you have sinusitis.  Sinusitis is inflammation and infection in the sinus cavities of the head.  Based on your presentation I believe you most likely have Acute Viral Sinusitis.This is an infection most likely caused by a virus. There is not specific treatment for viral sinusitis other than to help you with the symptoms until the infection runs its course.  You may use an oral decongestant such as Mucinex D or if you have glaucoma or high blood pressure use plain Mucinex. Saline nasal spray help and can safely be used as often as needed for congestion, I have prescribed: Ipratropium Bromide nasal spray 0.03% 2 sprays in eah nostril 2-3 times a day  We do not routinely recommend antibiotics prior to 7-10 days of congestion  Providers prescribe antibiotics to treat infections caused by bacteria. Antibiotics are very powerful in treating bacterial infections when they are used properly. To maintain their effectiveness, they should be used only when necessary. Overuse of antibiotics has resulted in the development of superbugs that are resistant to treatment!    After careful review of your answers, I would not recommend an antibiotic for your condition.  Antibiotics are not effective against viruses and therefore should not be used to treat them. Common examples of infections caused by viruses include colds and flu  Some authorities believe that zinc sprays or the use of Echinacea may shorten the course of your symptoms.  Sinus infections are not as easily transmitted as other respiratory infection, however we still recommend that you avoid close contact with loved ones, especially the very young and elderly.  Remember to wash your hands thoroughly throughout the day as this is the number one way to prevent the spread of infection!  Home Care: Only  take medications as instructed by your medical team. Do not take these medications with alcohol. A steam or ultrasonic humidifier can help congestion.  You can place a towel over your head and breathe in the steam from hot water coming from a faucet. Avoid close contacts especially the very young and the elderly. Cover your mouth when you cough or sneeze. Always remember to wash your hands.  Get Help Right Away If: You develop worsening fever or sinus pain. You develop a severe head ache or visual changes. Your symptoms persist after you have completed your treatment plan.  Make sure you Understand these instructions. Will watch your condition. Will get help right away if you are not doing well or get worse.   Thank you for choosing an e-visit.  Your e-visit answers were reviewed by a board certified advanced clinical practitioner to complete your personal care plan. Depending upon the condition, your plan could have included both over the counter or prescription medications.  Please review your pharmacy choice. Make sure the pharmacy is open so you can pick up prescription now. If there is a problem, you may contact your provider through [employer] and have the prescription routed to another pharmacy.  Your safety is important to [place]. If you have drug allergies check your prescription carefully.   For the next 24 hours you can use MyChart to ask questions about today's visit, request a non-urgent call back, or ask for a work or school excuse. You will get an email in the next two days asking about  your experience. I hope that your e-visit has been valuable and will speed your recovery.  Meds ordered this encounter  Medications   ipratropium (ATROVENT) 0.03 % nasal spray    Sig: Place 2 sprays into both nostrils every 12 (twelve) hours.    Dispense:  30 mL    Refill:  12    I spent approximately 5 minutes reviewing the patient's history, current symptoms and coordinating their  care today.

## 2023-03-28 ENCOUNTER — Ambulatory Visit
Admission: RE | Admit: 2023-03-28 | Discharge: 2023-03-28 | Disposition: A | Discharge status: Home / Self Care | Payer: BC Managed Care – PPO | Source: Ambulatory Visit | Attending: Family Medicine | Admitting: Family Medicine

## 2023-03-28 DIAGNOSIS — Z1231 Encounter for screening mammogram for malignant neoplasm of breast: Secondary | ICD-10-CM | POA: Diagnosis present

## 2023-12-10 ENCOUNTER — Other Ambulatory Visit (HOSPITAL_COMMUNITY)
Admission: RE | Admit: 2023-12-10 | Discharge: 2023-12-10 | Disposition: A | Discharge status: Home / Self Care | Source: Ambulatory Visit | Attending: Family Medicine | Admitting: Family Medicine

## 2023-12-10 ENCOUNTER — Ambulatory Visit: Payer: Self-pay | Admitting: Family Medicine

## 2023-12-10 ENCOUNTER — Encounter: Payer: Self-pay | Admitting: Family Medicine

## 2023-12-10 VITALS — BP 122/84 | HR 90 | Ht 64.0 in | Wt 214.0 lb

## 2023-12-10 DIAGNOSIS — Z1231 Encounter for screening mammogram for malignant neoplasm of breast: Secondary | ICD-10-CM

## 2023-12-10 DIAGNOSIS — Z124 Encounter for screening for malignant neoplasm of cervix: Secondary | ICD-10-CM | POA: Diagnosis not present

## 2023-12-10 DIAGNOSIS — Z01419 Encounter for gynecological examination (general) (routine) without abnormal findings: Secondary | ICD-10-CM

## 2023-12-10 NOTE — Progress Notes (Signed)
 Subjective:     Hannah Singleton is a 53 y.o. female and is here for a comprehensive physical exam. The patient reports no problems. No cycle since 07/2022.   The following portions of the patient's history were reviewed and updated as appropriate: allergies, current medications, past family history, past medical history, past social history, past surgical history, and problem list.  Review of Systems Pertinent items noted in HPI and remainder of comprehensive ROS otherwise negative.   Objective:  Chaperone present for exam   BP 122/84   Pulse 90   Ht 5\' 4"  (1.626 m)   Wt 214 lb (97.1 kg)   BMI 36.73 kg/m  General appearance: alert, cooperative, and appears stated age Head: Normocephalic, without obvious abnormality, atraumatic Neck: no adenopathy, supple, symmetrical, trachea midline, and thyroid  not enlarged, symmetric, no tenderness/mass/nodules Lungs: clear to auscultation bilaterally Breasts: normal appearance, no masses or tenderness Heart: regular rate and rhythm, S1, S2 normal, no murmur, click, rub or gallop Abdomen: soft, non-tender; bowel sounds normal; no masses,  no organomegaly Pelvic: cervix normal in appearance, external genitalia normal, no adnexal masses or tenderness, no cervical motion tenderness, uterus normal size, shape, and consistency, and vagina normal without discharge Extremities: extremities normal, atraumatic, no cyanosis or edema Pulses: 2+ and symmetric Skin: Skin color, texture, turgor normal. No rashes or lesions Lymph nodes: Cervical, supraclavicular, and axillary nodes normal. Neurologic: Grossly normal    Assessment:    GYN female exam.      Plan:  Encounter for gynecological examination without abnormal finding - Has PCP who does annual blood work.  Screening for malignant neoplasm of cervix - Pap today - Plan: Cytology - PAP  Encounter for screening mammogram for malignant neoplasm of breast - Mammogram order placed, due 03/2024 - Plan: MM  3D SCREENING MAMMOGRAM BILATERAL BREAST    See After Visit Summary for Counseling Recommendations

## 2023-12-10 NOTE — Progress Notes (Signed)
 Patient presents for Annual.  LMP: None  Last pap: 10/11/2020 Contraception: None. Mammogram: 03/28/2023 WNL  last mammogram was done at [facility] [place] STD Screening: Declines   CC: Annual/None

## 2023-12-12 ENCOUNTER — Ambulatory Visit: Payer: Self-pay | Admitting: Family Medicine

## 2023-12-12 LAB — CYTOLOGY - PAP
Comment: NEGATIVE
Diagnosis: NEGATIVE
High risk HPV: NEGATIVE

## 2024-03-29 ENCOUNTER — Ambulatory Visit
Admission: RE | Admit: 2024-03-29 | Discharge: 2024-03-29 | Disposition: A | Discharge status: Home / Self Care | Source: Ambulatory Visit | Attending: Family Medicine | Admitting: Family Medicine

## 2024-03-29 DIAGNOSIS — Z1231 Encounter for screening mammogram for malignant neoplasm of breast: Secondary | ICD-10-CM | POA: Insufficient documentation
